# Patient Record
Sex: Male | Born: 1960 | Race: White | Hispanic: No | Marital: Single | Smoking: Never smoker
Health system: Southern US, Community
[De-identification: ages and names within clinical notes are randomized; demographics above are authoritative.]

## PROBLEM LIST (undated history)

## (undated) DIAGNOSIS — F329 Major depressive disorder, single episode, unspecified: Secondary | ICD-10-CM

## (undated) DIAGNOSIS — Z8669 Personal history of other diseases of the nervous system and sense organs: Secondary | ICD-10-CM

## (undated) DIAGNOSIS — F32A Depression, unspecified: Secondary | ICD-10-CM

## (undated) DIAGNOSIS — F419 Anxiety disorder, unspecified: Secondary | ICD-10-CM

## (undated) HISTORY — PX: CARDIAC CATHETERIZATION: SHX172

## (undated) HISTORY — PX: NASAL SEPTUM SURGERY: SHX37

## (undated) HISTORY — PX: APPENDECTOMY: SHX54

---

## 1999-11-23 ENCOUNTER — Encounter (INDEPENDENT_AMBULATORY_CARE_PROVIDER_SITE_OTHER): Payer: Self-pay | Admitting: Specialist

## 1999-11-23 ENCOUNTER — Inpatient Hospital Stay (HOSPITAL_COMMUNITY): Admission: EM | Admit: 1999-11-23 | Discharge: 1999-11-25 | Payer: Self-pay | Admitting: Emergency Medicine

## 2000-01-03 ENCOUNTER — Encounter: Payer: Self-pay | Admitting: Internal Medicine

## 2000-01-03 ENCOUNTER — Ambulatory Visit (HOSPITAL_COMMUNITY): Admission: RE | Admit: 2000-01-03 | Discharge: 2000-01-03 | Payer: Self-pay | Admitting: Internal Medicine

## 2000-08-08 ENCOUNTER — Ambulatory Visit (HOSPITAL_COMMUNITY): Admission: RE | Admit: 2000-08-08 | Discharge: 2000-08-08 | Payer: Self-pay | Admitting: Internal Medicine

## 2000-08-08 ENCOUNTER — Encounter: Payer: Self-pay | Admitting: Internal Medicine

## 2001-02-06 ENCOUNTER — Encounter: Admission: RE | Admit: 2001-02-06 | Discharge: 2001-02-06 | Payer: Self-pay | Admitting: Neurosurgery

## 2001-03-10 ENCOUNTER — Encounter: Admission: RE | Admit: 2001-03-10 | Discharge: 2001-03-10 | Payer: Self-pay | Admitting: Neurosurgery

## 2001-11-27 ENCOUNTER — Encounter: Admission: RE | Admit: 2001-11-27 | Discharge: 2001-11-27 | Payer: Self-pay | Admitting: Internal Medicine

## 2001-11-27 ENCOUNTER — Encounter: Payer: Self-pay | Admitting: Internal Medicine

## 2001-12-11 ENCOUNTER — Encounter: Payer: Self-pay | Admitting: Urology

## 2001-12-11 ENCOUNTER — Ambulatory Visit (HOSPITAL_BASED_OUTPATIENT_CLINIC_OR_DEPARTMENT_OTHER): Admission: RE | Admit: 2001-12-11 | Discharge: 2001-12-11 | Payer: Self-pay | Admitting: Urology

## 2002-06-26 ENCOUNTER — Encounter: Admission: RE | Admit: 2002-06-26 | Discharge: 2002-06-26 | Payer: Self-pay | Admitting: Internal Medicine

## 2002-06-26 ENCOUNTER — Encounter: Payer: Self-pay | Admitting: Internal Medicine

## 2006-08-25 IMAGING — CT CT HEAD W/O CM - CT MAXILLOFACIAL W/O CM
3 series · 16 of 47 positions shown, 19 images · non-contrast
Comparison: NONE

CLINICAL DATA: Allergies, sinus troubles times 20 years. 

CT PARANASAL SINUSES WITHOUT INTRAVENOUS CONTRAST
TECHNIQUE: Multiple axial 3 mm thick slices at 3 mm intervals 
were obtained. Sagittal and coronal reconstructions were obtained 
as well.

[Series 3: 3x3 · axial · 0.35mm/px · z∈[-444,-318]mm · 10 of 50 slices shown, 13 images]
[im 4/50  brain]
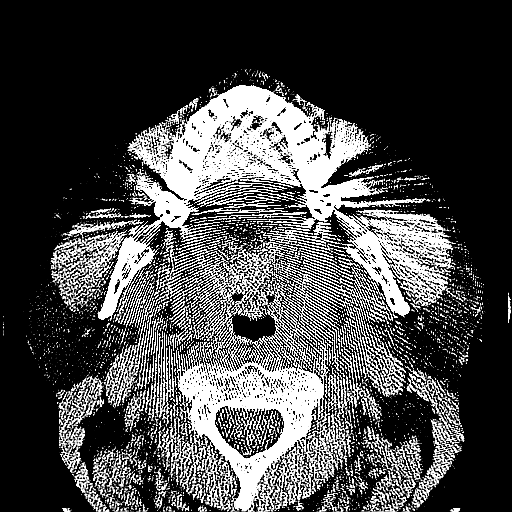
[im 4/50  bone]
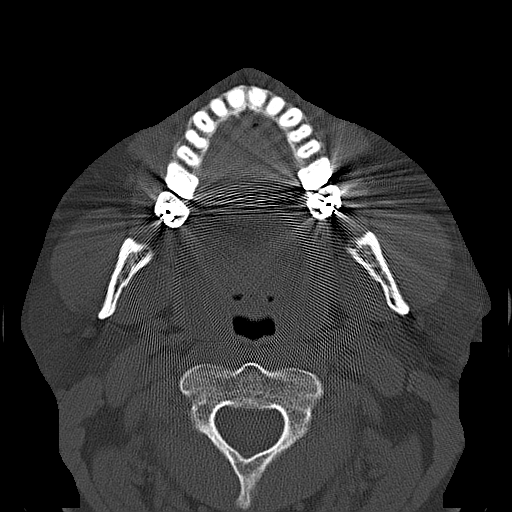
[im 9/50  bone]
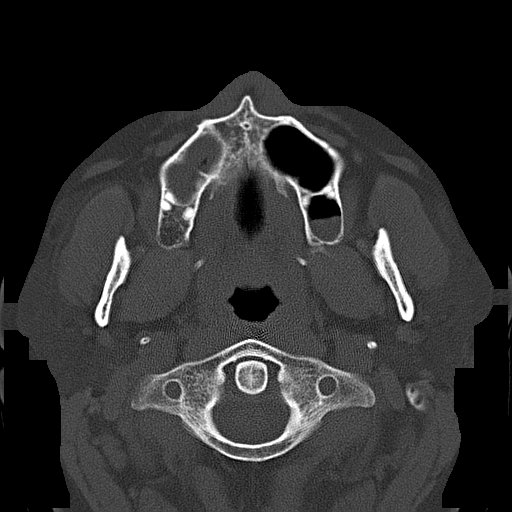
[im 12/50  bone]
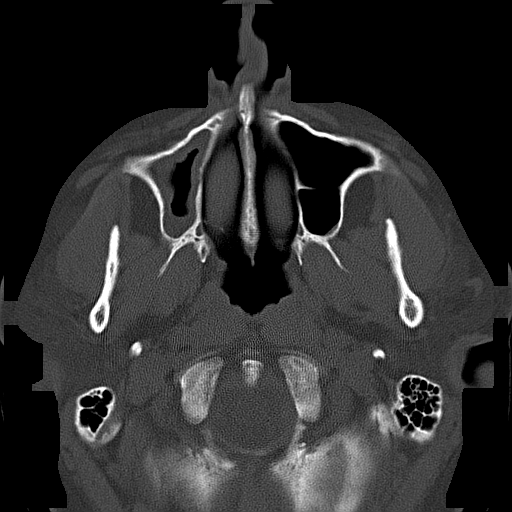
[im 17/50  bone]
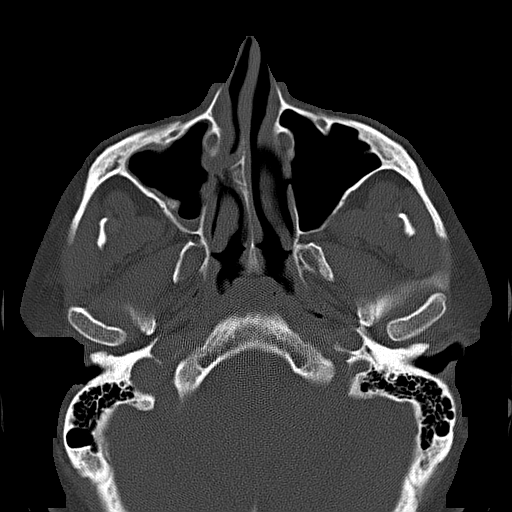
[im 22/50  brain]
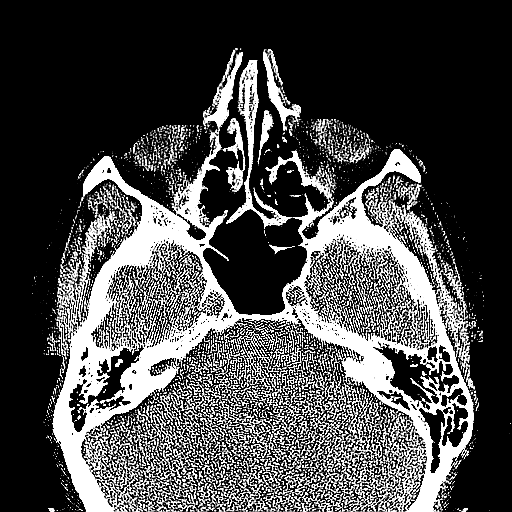
[im 22/50  bone]
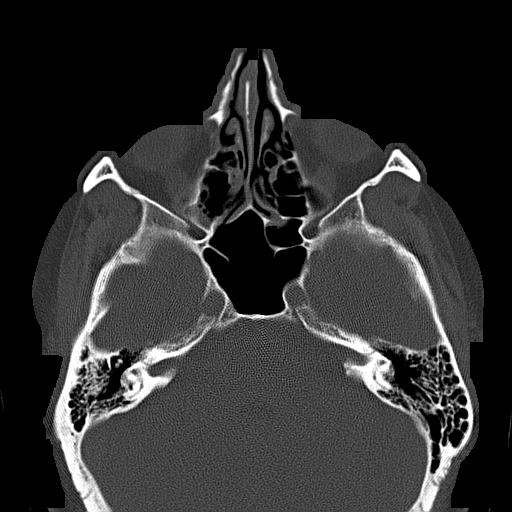
[im 26/50  bone]
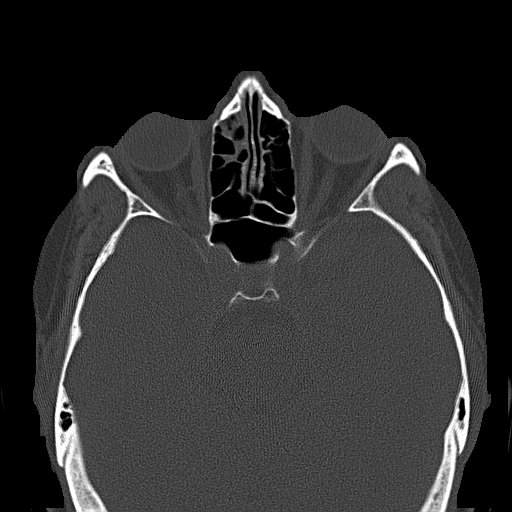
[im 31/50  bone]
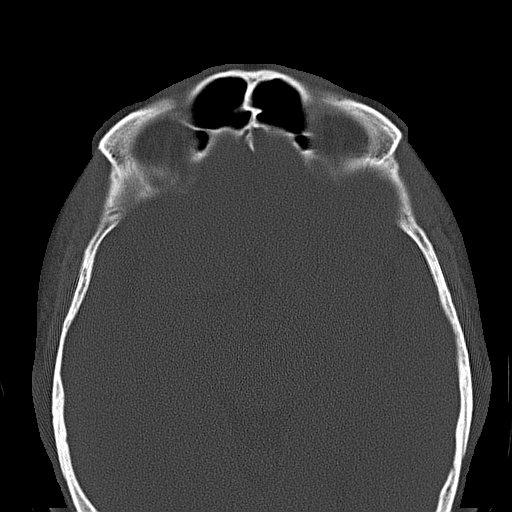
[im 38/50  bone]
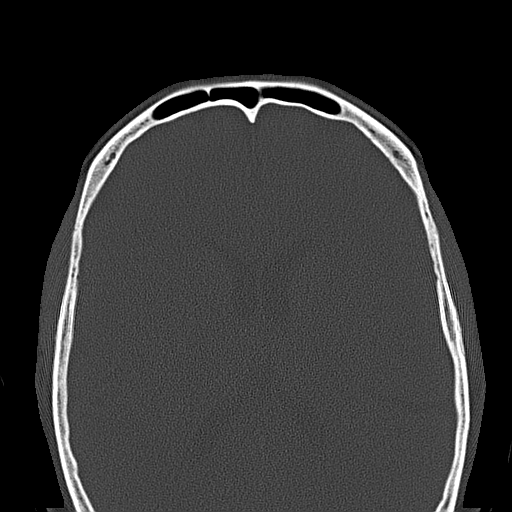
[im 41/50  brain]
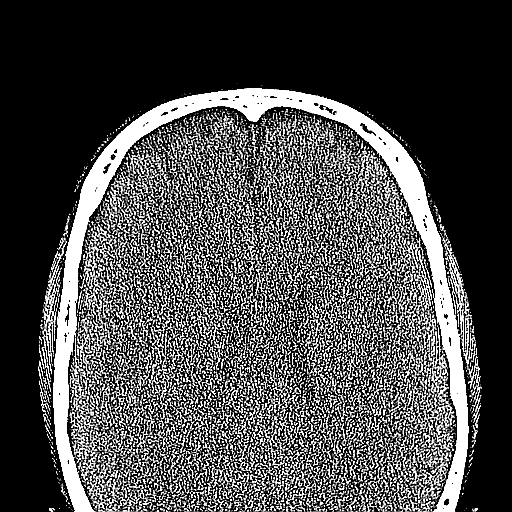
[im 41/50  bone]
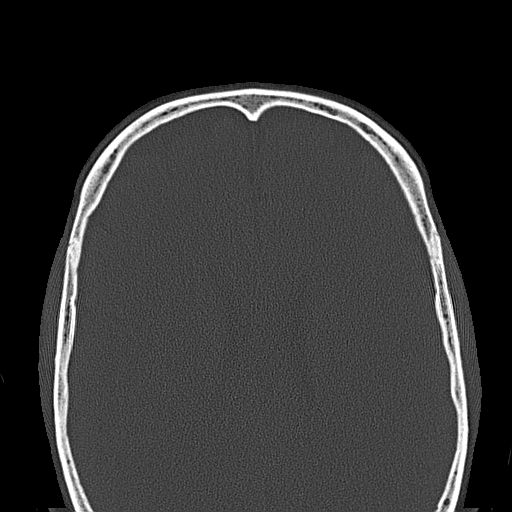
[im 46/50  bone]
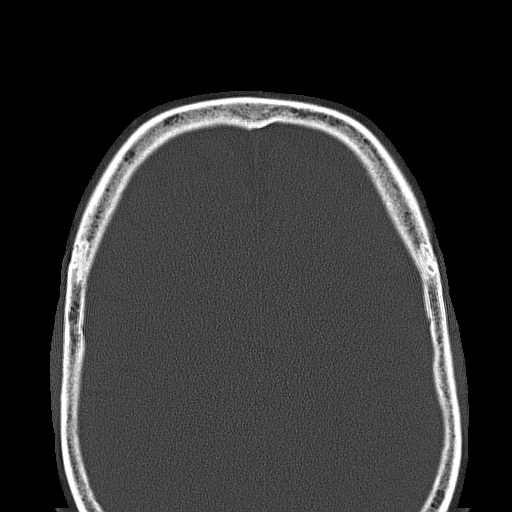

[coronals · coronal · 0.35mm/px · 3 of 32 slices shown]
[im 11/32  bone]
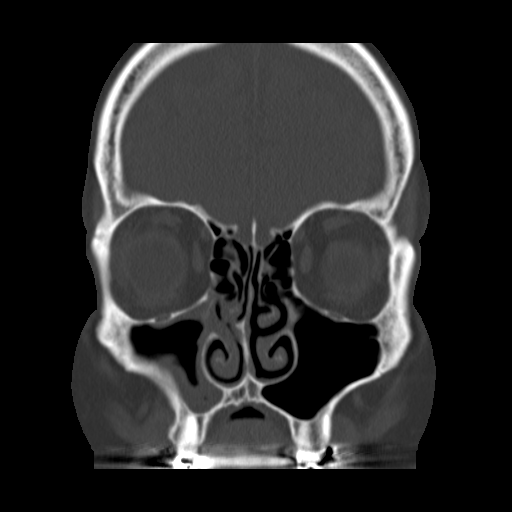
[im 14/32  bone]
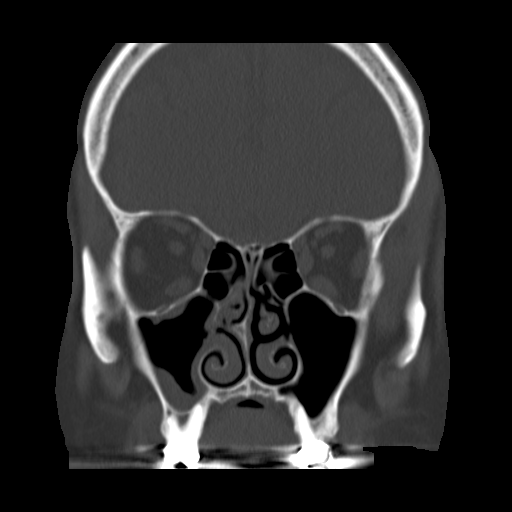
[im 18/32  bone]
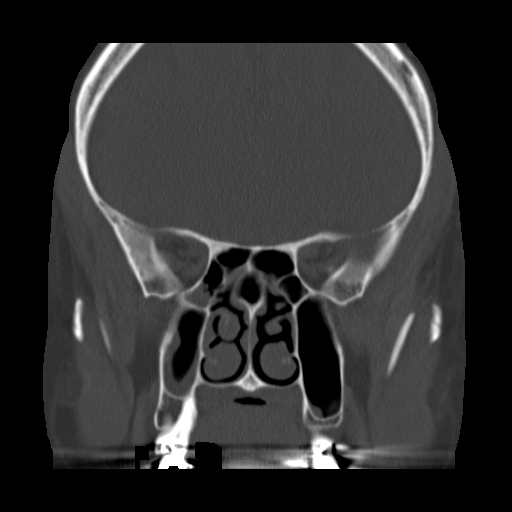

[sagittals · sagittal · 0.35mm/px · 3 of 34 slices shown]
[im 12/34  bone]
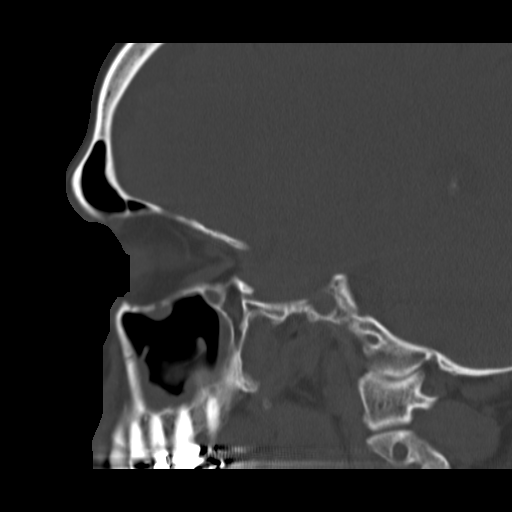
[im 17/34  bone]
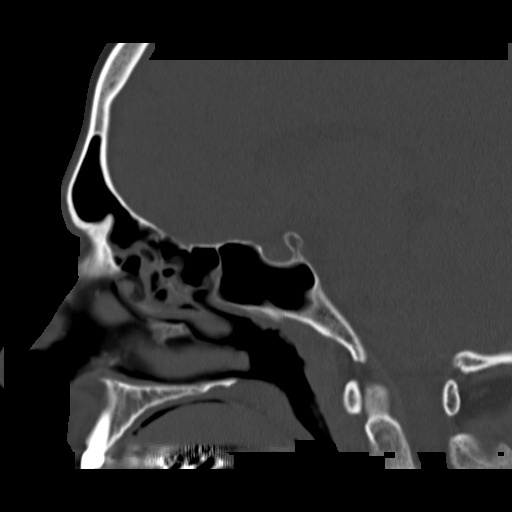
[im 23/34  bone]
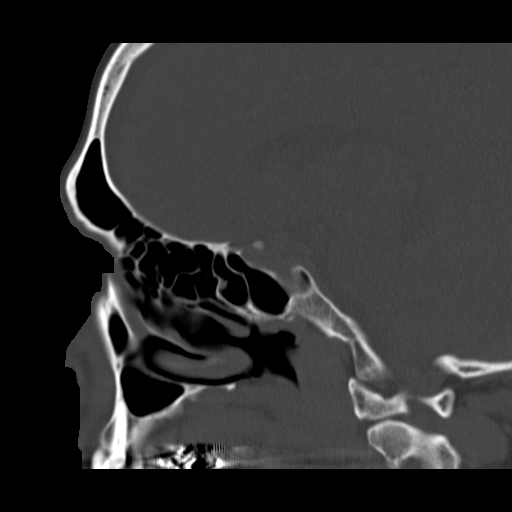

[16 of 47 positions shown; findings below may reference images not displayed]

FINDINGS: Study reveals moderate mucoperiosteal thickening in the 
right maxillary and middle ethmoid sinuses with obstruction of the 
osteomeatal unit with debris.  Left maxillary sinus is clear.  
Mild right frontal ethmoid recess sinusitis.  Sphenoid sinus is 
clear.  Nasal septum is deviated to the right with a very large 
rightward septal spur.  Right inferior and middle turbinates are 
mildly enlarged.
IMPRESSION: Right-sided sinusitis, right-sided septal deviation 
and spur and turbinate hypertrophy. Tchami Pambe, M.D. 
Date: 12/10/2005 NBC  JLM

## 2010-06-02 ENCOUNTER — Encounter
Admission: RE | Admit: 2010-06-02 | Discharge: 2010-06-02 | Payer: Self-pay | Source: Home / Self Care | Attending: Neurosurgery | Admitting: Neurosurgery

## 2010-11-03 NOTE — H&P (Signed)
[place]  Patient:    Arthur Cole, Arthur Cole                     MRN: [id number] Adm. Date:  [id number] Attending:  Bonnetta Barry CC:         Lorre Nick, M.D.                         History and Physical  ADMITTING DIAGNOSIS: Acute appendicitis.  HISTORY OF PRESENT ILLNESS: The patient is a 50 year old white male who awoke this morning with diffuse abdominal pain followed by onset of nausea and vomiting on two occasions.  The pain then localized to the right lower quadrant and persisted through the day.  He has had a low-grade fever.  He presented to the [facility] Emergency Department on the afternoon of November 23, 1999 for evaluation.  The patient was noted to have a temperature of 100.5 degrees.  The emergency room physician evaluated him and felt examination was consistent with acute appendicitis.  CT scan of the abdomen was ordered and general surgery was consulted.  WBC was noted to be elevated at 12,300.  After discussion with the patient and family the decision was made to proceed to appendectomy.  CAT scan of the abdomen was cancelled.  PAST MEDICAL HISTORY: History of environmental allergies.  PAST SURGICAL HISTORY: None.  MEDICATIONS:  1. Nasalide p.r.n.  2. Over-the-counter sleep medication.  ALLERGIES: No known drug allergies.  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  He does not smoke.  He does not drink alcohol. He is accompanied by his wife.  REVIEW OF SYSTEMS: Fifteen system review was discussed with the patient and documented on the emergency room sheet without significant positives.  FAMILY HISTORY: Noncontributory.  PHYSICAL EXAMINATION:  GENERAL: The patient is a 50 year old well-developed, well-nourished white male on a stretcher in the emergency department.  VITAL SIGNS: Temperature 100.5 degrees, blood pressure 123/71, pulse  90, respirations 24.  HEENT: Head normocephalic.  Sclerae clear.  Mucous membranes are slightly dry. Dentition is good.  NECK: Supple, thyroid without nodularity.  CHEST: Clear to auscultation and percussion.  No costovertebral angle tenderness.  CARDIAC: Regular rate and rhythm with a soft diastolic murmur at the upper left sternal border.  Peripheral pulses full.  ABDOMEN: Soft.  There are bowel sounds present.  There is tenderness to percussion, however, in the right lower quadrant at approximately McBurneys point and just lateral to McBurneys point.  There is tenderness to palpation in this region without palpable mass.  There is rebound tenderness in the right lower quadrant.  EXTREMITIES:  Nontender, without edema.  NEUROLOGIC: The patient is alert and oriented to person, place, and time, without focal neurologic deficit.  LABORATORY DATA: CBC demonstrates a WBC of 12.3, hemoglobin 13.2, hematocrit 38.7%, platelet count 222,000; Differential shows 77% neutrophils, 16% lymphocytes, 5% monocytes, 1% eosinophils.  Urinalysis is benign.  IMPRESSION: Probable acute appendicitis.  PLAN: I had a lengthy discussion with the patient and his wife at the bedside and explained to them the signs and symptoms of acute appendicitis.  We discussed the etiology of appendicitis as well as the surgical management.  I discussed with them the use of a laparoscope versus an open technique.  I also explained to them that based on his clinical presentation and laboratory studies I did not feel a  CT scan was indicated, and we have cancelled the CT scan of the abdomen.  The patient is in agreement to proceed with laparoscopic appendectomy.  They know that the diagnostic accuracy based on clinical rounds in approximately 90%.  They also understand the risk of conversion to an open procedure.  I discussed the course of the hospitalization under routine circumstances as well as his convalescence at  home and return to work.  The patient understands and wishes to proceed.  1. Admit to [facility].  2. Initiation of intravenous antibiotics.  3. NPO.  4. Operating room for laparoscopic appendectomy this evening. DD:  11/23/99 TD:  11/24/99 Job: [id number]

## 2010-11-03 NOTE — Op Note (Signed)
Arthur Cole. [facility]  Patient:    [name], [name]                     MRN: [id number] Proc. Date: 11/23/99 Adm. Date:  [id number] Disc. Date: [id number] Attending:  Bonnetta Barry                           Operative Report  PREOPERATIVE DIAGNOSIS:  Acute appendicitis.  POSTOPERATIVE DIAGNOSIS:  Acute appendicitis.  PROCEDURE:  Laparoscopic appendectomy.  SURGEON:  Velora Heckler, M.D.  ANESTHESIA:  General.  ESTIMATED BLOOD LOSS:  Minimal.  PREPARATION:  Betadine.  COMPLICATIONS:  None.  INDICATIONS:  The patient is a 50 year old white male who presents with 12-hour history of abdominal pain, localizing to the right lower quadrant, associated with nausea and vomiting.  Physical exam is consistent with acute appendicitis.  White blood cell count is elevated to 12,300, and temperature is 100.5 degrees.  Diagnosis of appendicitis is made.  The patient is brought to the operating room for exploration.  DESCRIPTION OF PROCEDURE:  The procedure was done in OR #15 at the [facility].  The patient was brought to the operating room and placed in the supine position on the operating room table.  Following the administration of general anesthesia, the patient was prepped and draped in the usual strict aseptic fashion.  After ascertaining that an adequate level of anesthesia had been obtained, a supraumbilical incision was made with a #15 blade.  Dissection was carried down through the subcutaneous tissues.  The fascia was incised in the midline.  The peritoneal cavity was entered cautiously.  An 0 Vicryl pursestring suture was placed in the fascia.  An Hassan cannula was introduced under direct vision and secured with a pursestring suture.  The abdomen was explored following insufflation with carbon dioxide.  The liver was normal.  The gallbladder was normal.  The stomach is normal.  The colon appears normal.  The sigmoid colon and  pelvis appeared normal.  In the right lower quadrant, there is an obvious inflamed appendix which is sitting relatively anterior.  It is thickened and indurated, but there is no sign of perforation.  Operative ports were placed along the right costal margin and in the left lower quadrant.  The appendix was grasped and retracted anteriorly.  Using a [employer], a window was made at the base of the appendix.  Using the GIA-type stapler with vascular cartridge, the appendiceal base was transected.  A _______ for transection of the mesoappendix.  The appendix was placed into an Endocatch bag and withdrawn through the left lower quadrant port without difficulty.  The right lower quadrant was then inspected for hemostasis.  Good hemostasis was noted.  Pneumoperitoneum was released and ports were removed under direct vision.  The 0 Vicryl pursestring suture was tied securely.  The wounds were anesthetized with local anesthetic.  All three wound were closed with interrupted 4-0 Vicryl subcuticular sutures.  Benzoin and Steri-Strips were applied. Sterile gauze dressings were applied.  The patient was awakened from anesthesia and taken to the recovery room in stable condition.  The patient tolerated the procedure well. DD:  11/23/99 TD:  11/28/99 Job: [id number]

## 2010-11-03 NOTE — Discharge Summary (Signed)
[place]. [facility]  Patient:    Arthur Cole, Arthur Cole                     MRN: [id number] Adm. Date:  [id number] Disc. Date: [id number] Attending:  Bonnetta Barry CC:         Velora Heckler, M.D.                           Discharge Summary  CHIEF COMPLAINT: Acute appendicitis.  HISTORY OF PRESENT ILLNESS: The patient is a 50 year old white male who presented to the emergency department with a 12 hour history of abdominal pain localizing to the right lower quadrant.  This was accompanied by fever and nausea and vomiting.  PHYSICAL EXAMINATION: Physical examination was consistent with acute appendicitis.  LABORATORY DATA: Laboratory studies showed an elevated WBC of 12,300 with 77% segmented neutrophils.  HOSPITAL COURSE: After full consultation the patient was prepared and taken to the operating room.  The patient was taken to the operating room on the evening of November 23, 1999 and underwent laparoscopic appendectomy.  His postoperative course was straightforward.  He was treated with 24 hours of IV antibiotics.  He was advanced from clear liquids to a regular diet.  He was prepared for discharge home on the second postoperative day.  DISPOSITION: The patient is discharged home on November 25, 1999.  DISCHARGE CONDITION: Good, tolerating a regular diet and ambulating independently.  FOLLOW-UP: The patient will be seen back in my office at [facility] in two weeks.  DISCHARGE MEDICATIONS: Vicodin as-needed for pain.  FINAL DIAGNOSIS: Acute suppurative appendicitis. DD:  12/14/99 TD:  12/15/99 Job: [id number]

## 2014-01-05 DIAGNOSIS — N529 Male erectile dysfunction, unspecified: Secondary | ICD-10-CM | POA: Insufficient documentation

## 2014-01-05 DIAGNOSIS — E291 Testicular hypofunction: Secondary | ICD-10-CM | POA: Insufficient documentation

## 2014-02-08 DIAGNOSIS — E291 Testicular hypofunction: Secondary | ICD-10-CM | POA: Insufficient documentation

## 2014-10-06 ENCOUNTER — Other Ambulatory Visit: Payer: Self-pay | Admitting: Anesthesiology

## 2014-10-20 ENCOUNTER — Encounter (HOSPITAL_COMMUNITY): Payer: Self-pay

## 2014-10-20 ENCOUNTER — Encounter (HOSPITAL_COMMUNITY)
Admission: RE | Admit: 2014-10-20 | Discharge: 2014-10-20 | Disposition: A | Discharge status: Home / Self Care | Payer: Managed Care, Other (non HMO) | Source: Ambulatory Visit | Attending: Anesthesiology | Admitting: Anesthesiology

## 2014-10-20 DIAGNOSIS — M4726 Other spondylosis with radiculopathy, lumbar region: Secondary | ICD-10-CM | POA: Insufficient documentation

## 2014-10-20 DIAGNOSIS — Z01812 Encounter for preprocedural laboratory examination: Secondary | ICD-10-CM | POA: Insufficient documentation

## 2014-10-20 HISTORY — DX: Depression, unspecified: F32.A

## 2014-10-20 HISTORY — DX: Major depressive disorder, single episode, unspecified: F32.9

## 2014-10-20 LAB — BASIC METABOLIC PANEL
Anion gap: 8 (ref 5–15)
BUN: 10 mg/dL (ref 6–20)
CALCIUM: 8.7 mg/dL — AB (ref 8.9–10.3)
CO2: 26 mmol/L (ref 22–32)
Chloride: 105 mmol/L (ref 101–111)
Creatinine, Ser: 1.22 mg/dL (ref 0.61–1.24)
Glucose, Bld: 90 mg/dL (ref 70–99)
Potassium: 4.4 mmol/L (ref 3.5–5.1)
SODIUM: 139 mmol/L (ref 135–145)

## 2014-10-20 LAB — CBC
HEMATOCRIT: 35.4 % — AB (ref 39.0–52.0)
Hemoglobin: 10.9 g/dL — ABNORMAL LOW (ref 13.0–17.0)
MCH: 22.3 pg — ABNORMAL LOW (ref 26.0–34.0)
MCHC: 30.8 g/dL (ref 30.0–36.0)
MCV: 72.4 fL — ABNORMAL LOW (ref 78.0–100.0)
Platelets: 198 10*3/uL (ref 150–400)
RBC: 4.89 MIL/uL (ref 4.22–5.81)
RDW: 17 % — AB (ref 11.5–15.5)
WBC: 7 10*3/uL (ref 4.0–10.5)

## 2014-10-20 LAB — SURGICAL PCR SCREEN
MRSA, PCR: NEGATIVE
Staphylococcus aureus: NEGATIVE

## 2014-10-20 NOTE — Pre-Procedure Instructions (Signed)
Arthur Cole  10/20/2014   Your procedure is scheduled on:  Oct 29, 2014  Report to [facility] Admitting at 7:45 AM.  Call this number if you have problems the morning of surgery: [phone]   Remember:   Do not eat food or drink liquids after midnight.   Take these medicines the morning of surgery with A SIP OF WATER: none    STOP ASPIRIN, NSAIDS (ADVIL, ALEVE, IBUPROFEN), HERBAL SUPPLEMENTS ONE WEEK PRIOR TO SURGERY   Do not wear jewelry.  Do not wear lotions, powders, or colognes. You may wear deodorant.  Men may shave face and neck.  Do not bring valuables to the hospital.  [facility] is not responsible for any belongings or valuables.               Contacts, dentures or bridgework may not be worn into surgery.  Leave suitcase in the car. After surgery it may be brought to your room.  For patients admitted to the hospital, discharge time is determined by your treatment team.               Patients discharged the day of surgery will not be allowed to drive home.  Name and phone number of your driver:   Special Instructions: "preparing for surgery"   Please read over the following fact sheets that you were given: Pain Booklet, Coughing and Deep Breathing and Surgical Site Infection Prevention

## 2014-10-21 ENCOUNTER — Other Ambulatory Visit (HOSPITAL_COMMUNITY): Payer: Self-pay

## 2014-10-28 MED ORDER — SODIUM CHLORIDE 0.9 % IV SOLN
1500.0000 mg | INTRAVENOUS | Status: AC
Start: 2014-10-29 — End: 2014-10-29
  Administered 2014-10-29: 1500 mg via INTRAVENOUS
  Filled 2014-10-28: qty 1500

## 2014-10-28 NOTE — H&P (Signed)
Arthur Cole is an 54 y.o. male.   Chief Complaint: Thoracic and lumbar pain, radiation towards lower extremities HPI: Exceptionally pleasant 54 year old gentleman, with multilevel lumbar spondylosis/disc degeneration, with associated complaints of low back pain and radiation towards lower extremities.  He has undergone physical therapy, medication management, interventional pain management, none of which gave him sustained benefit of any type or gave him substantial side effects.  His back pain was extremely limiting to him, and with failure of all these other modalities, spinal cord supinator therapy was recommended as a trial. The patient reported that he's had about a 60-70% improvement in his thoracic and low back pain during the course of the trial.  He did not use any of his pain medication in the last 3-4 days of the trial, and was using only random medication for the first few days of his trial.  Overall, he is extremely pleased with his outcome.    Past Medical History  Diagnosis Date  . Anxiety   . Hx of sleep apnea     "had surgery to correct"  . Depression     "2009 hospitalized for depression after death of mother, drug overdose"    Past Surgical History  Procedure Laterality Date  . Appendectomy    . Cardiac catheterization      Dr. Elsie LincolnGamble, 13 years ago  . Nasal septum surgery      No family history on file. Social History:  reports that he has never smoked. He does not have any smokeless tobacco history on file. He reports that he does not drink alcohol or use illicit drugs.  Allergies:  Allergies  Allergen Reactions  . Amoxicillin Itching    "swelling and hives"  . Penicillins Itching    "swelling and hives"    Medications Prior to Admission  Medication Sig Dispense Refill  . clorazepate (TRANXENE) 7.5 MG tablet Take 3.75-7.5 mg by mouth 2 (two) times daily as needed.    . cyclobenzaprine (FLEXERIL) 10 MG tablet Take 10 mg by mouth 3 (three) times daily as  needed for muscle spasms.    . fluticasone (FLONASE) 50 MCG/ACT nasal spray Place 2 sprays into both nostrils daily.    . tadalafil (CIALIS) 20 MG tablet Take 1 tablet by mouth daily as needed.    . traZODone (DESYREL) 100 MG tablet Take 100 mg by mouth at bedtime.    . Testosterone 75 MG PLLT Inject 75 mg into the skin every 3 (three) months.      No results found for this or any previous visit (from the past 48 hour(s)). No results found.  Review of Systems  Constitutional: Negative.   HENT: Negative.   Eyes: Negative.   Respiratory: Negative.   Cardiovascular: Negative.   Gastrointestinal: Negative.   Genitourinary: Negative.   Musculoskeletal: Positive for back pain.  Skin: Negative.   Neurological: Negative.   Endo/Heme/Allergies: Negative.   Psychiatric/Behavioral: Negative.     Blood pressure 141/89, pulse 83, temperature 97 F (36.1 C), temperature source Oral, resp. rate 18, weight 119.296 kg (263 lb), SpO2 100 %. Physical Exam  Constitutional: He is oriented to person, place, and time. He appears well-developed and well-nourished.  HENT:  Head: Normocephalic and atraumatic.  Eyes: EOM are normal. Pupils are equal, round, and reactive to light.  Neck: Normal range of motion.  Cardiovascular: Normal rate and regular rhythm.   Musculoskeletal: Normal range of motion.  Neurological: He is alert and oriented to person, place, and time.  Skin: Skin is warm and dry.  Psychiatric: He has a normal mood and affect. His behavior is normal. Judgment and thought content normal.     Assessment/Plan ASSESSMENT: Multiple level lumbar spondylosis, low back pain, thoracic pain, radiation towards lower extremity is PLAN: Permanent implant of [employer] SCS  Gwynne Edingeraul C Marissia Blackham 10/29/2014, 8:44 AM

## 2014-10-29 ENCOUNTER — Encounter (HOSPITAL_COMMUNITY): Payer: Self-pay | Admitting: Certified Registered Nurse Anesthetist

## 2014-10-29 ENCOUNTER — Ambulatory Visit (HOSPITAL_COMMUNITY)
Admission: RE | Admit: 2014-10-29 | Discharge: 2014-10-29 | Disposition: A | Discharge status: Home / Self Care | Payer: Managed Care, Other (non HMO) | Source: Ambulatory Visit | Attending: Anesthesiology | Admitting: Anesthesiology

## 2014-10-29 ENCOUNTER — Ambulatory Visit (HOSPITAL_COMMUNITY): Payer: Managed Care, Other (non HMO) | Admitting: Certified Registered Nurse Anesthetist

## 2014-10-29 ENCOUNTER — Ambulatory Visit (HOSPITAL_COMMUNITY): Payer: Managed Care, Other (non HMO)

## 2014-10-29 ENCOUNTER — Encounter (HOSPITAL_COMMUNITY)
Admission: RE | Disposition: A | Discharge status: Home / Self Care | Payer: Self-pay | Source: Ambulatory Visit | Attending: Anesthesiology

## 2014-10-29 DIAGNOSIS — M5136 Other intervertebral disc degeneration, lumbar region: Secondary | ICD-10-CM | POA: Insufficient documentation

## 2014-10-29 DIAGNOSIS — G8929 Other chronic pain: Secondary | ICD-10-CM | POA: Insufficient documentation

## 2014-10-29 DIAGNOSIS — Z419 Encounter for procedure for purposes other than remedying health state, unspecified: Secondary | ICD-10-CM

## 2014-10-29 DIAGNOSIS — K219 Gastro-esophageal reflux disease without esophagitis: Secondary | ICD-10-CM | POA: Diagnosis not present

## 2014-10-29 HISTORY — DX: Anxiety disorder, unspecified: F41.9

## 2014-10-29 HISTORY — DX: Personal history of other diseases of the nervous system and sense organs: Z86.69

## 2014-10-29 HISTORY — PX: SPINAL CORD STIMULATOR INSERTION: SHX5378

## 2014-10-29 SURGERY — INSERTION, SPINAL CORD STIMULATOR, LUMBAR
Anesthesia: Monitor Anesthesia Care

## 2014-10-29 MED ORDER — FENTANYL CITRATE (PF) 100 MCG/2ML IJ SOLN
INTRAMUSCULAR | Status: DC | PRN
  Administered 2014-10-29 (×6): 25 ug via INTRAVENOUS

## 2014-10-29 MED ORDER — LIDOCAINE HCL (CARDIAC) 20 MG/ML IV SOLN
INTRAVENOUS | Status: AC
  Filled 2014-10-29: qty 5

## 2014-10-29 MED ORDER — PROPOFOL 10 MG/ML IV BOLUS
INTRAVENOUS | Status: AC
  Filled 2014-10-29: qty 20

## 2014-10-29 MED ORDER — LACTATED RINGERS IV SOLN
INTRAVENOUS | Status: DC
  Administered 2014-10-29 (×2): via INTRAVENOUS

## 2014-10-29 MED ORDER — SODIUM CHLORIDE 0.9 % IR SOLN
Status: DC | PRN
  Administered 2014-10-29: 09:00:00

## 2014-10-29 MED ORDER — MEPERIDINE HCL 25 MG/ML IJ SOLN: 6.2500 mg | INTRAMUSCULAR | Status: DC | PRN

## 2014-10-29 MED ORDER — ROCURONIUM BROMIDE 50 MG/5ML IV SOLN
INTRAVENOUS | Status: AC
  Filled 2014-10-29: qty 1

## 2014-10-29 MED ORDER — 0.9 % SODIUM CHLORIDE (POUR BTL) OPTIME
TOPICAL | Status: DC | PRN
  Administered 2014-10-29: 1000 mL

## 2014-10-29 MED ORDER — ONDANSETRON HCL 4 MG/2ML IJ SOLN
INTRAMUSCULAR | Status: AC
  Filled 2014-10-29: qty 2

## 2014-10-29 MED ORDER — EPHEDRINE SULFATE 50 MG/ML IJ SOLN
INTRAMUSCULAR | Status: AC
  Filled 2014-10-29: qty 1

## 2014-10-29 MED ORDER — CLINDAMYCIN HCL 150 MG PO CAPS: 150.0000 mg | ORAL_CAPSULE | Freq: Three times a day (TID) | ORAL | Status: DC

## 2014-10-29 MED ORDER — FENTANYL CITRATE (PF) 250 MCG/5ML IJ SOLN
INTRAMUSCULAR | Status: AC
  Filled 2014-10-29: qty 5

## 2014-10-29 MED ORDER — BUPIVACAINE-EPINEPHRINE (PF) 0.5% -1:200000 IJ SOLN
INTRAMUSCULAR | Status: DC | PRN
Start: 2014-10-29 — End: 2014-10-29
  Administered 2014-10-29: 39 mL via PERINEURAL

## 2014-10-29 MED ORDER — MIDAZOLAM HCL 2 MG/2ML IJ SOLN: 0.5000 mg | Freq: Once | INTRAMUSCULAR | Status: DC | PRN

## 2014-10-29 MED ORDER — FENTANYL CITRATE (PF) 100 MCG/2ML IJ SOLN: 25.0000 ug | INTRAMUSCULAR | Status: DC | PRN

## 2014-10-29 MED ORDER — BACITRACIN-NEOMYCIN-POLYMYXIN OINTMENT TUBE
TOPICAL_OINTMENT | CUTANEOUS | Status: DC | PRN
  Administered 2014-10-29: 1 via TOPICAL

## 2014-10-29 MED ORDER — MIDAZOLAM HCL 5 MG/5ML IJ SOLN
INTRAMUSCULAR | Status: DC | PRN
  Administered 2014-10-29: 2 mg via INTRAVENOUS

## 2014-10-29 MED ORDER — MIDAZOLAM HCL 2 MG/2ML IJ SOLN
INTRAMUSCULAR | Status: AC
  Filled 2014-10-29: qty 2

## 2014-10-29 MED ORDER — HYDROCODONE-ACETAMINOPHEN 10-325 MG PO TABS: 1.0000 | ORAL_TABLET | ORAL | Status: AC | PRN

## 2014-10-29 MED ORDER — SODIUM CHLORIDE 0.9 % IJ SOLN
INTRAMUSCULAR | Status: AC
  Filled 2014-10-29: qty 10

## 2014-10-29 MED ORDER — PROMETHAZINE HCL 25 MG/ML IJ SOLN: 6.2500 mg | INTRAMUSCULAR | Status: DC | PRN

## 2014-10-29 MED ORDER — PROPOFOL INFUSION 10 MG/ML OPTIME
INTRAVENOUS | Status: DC | PRN
  Administered 2014-10-29: 25 ug/kg/min via INTRAVENOUS

## 2014-10-29 SURGICAL SUPPLY — 69 items
ANCH LD 4 SETX2 CLIK X (Stimulator) ×1 IMPLANT
ANCHOR CLIK X NEURO (Stimulator) ×1 IMPLANT
APL SKNCLS STERI-STRIP NONHPOA (GAUZE/BANDAGES/DRESSINGS)
BAG DECANTER FOR FLEXI CONT (MISCELLANEOUS) ×2 IMPLANT
BENZOIN TINCTURE PRP APPL 2/3 (GAUZE/BANDAGES/DRESSINGS) IMPLANT
BINDER ABDOMINAL 12 ML 46-62 (SOFTGOODS) ×2 IMPLANT
BLADE CLIPPER SURG (BLADE) IMPLANT
CABLE/EXTENSION OR 1X16 61 (CABLE) ×2 IMPLANT
CHLORAPREP W/TINT 26ML (MISCELLANEOUS) ×2 IMPLANT
CLIP TI WIDE RED SMALL 6 (CLIP) IMPLANT
CONT SPEC 4OZ CLIKSEAL STRL BL (MISCELLANEOUS) ×2 IMPLANT
DRAPE C-ARM 42X72 X-RAY (DRAPES) ×2 IMPLANT
DRAPE C-ARMOR (DRAPES) ×2 IMPLANT
DRAPE LAPAROTOMY 100X72X124 (DRAPES) ×2 IMPLANT
DRAPE POUCH INSTRU U-SHP 10X18 (DRAPES) ×2 IMPLANT
DRAPE SURG 17X23 STRL (DRAPES) ×2 IMPLANT
DRSG OPSITE POSTOP 3X4 (GAUZE/BANDAGES/DRESSINGS) ×1 IMPLANT
DRSG OPSITE POSTOP 4X6 (GAUZE/BANDAGES/DRESSINGS) ×1 IMPLANT
DRSG TELFA 3X8 NADH (GAUZE/BANDAGES/DRESSINGS) IMPLANT
ELECT REM PT RETURN 9FT ADLT (ELECTROSURGICAL) ×2
ELECTRODE REM PT RTRN 9FT ADLT (ELECTROSURGICAL) ×1 IMPLANT
GAUZE SPONGE 4X4 16PLY XRAY LF (GAUZE/BANDAGES/DRESSINGS) ×2 IMPLANT
GLOVE BIOGEL PI IND STRL 7.5 (GLOVE) ×1 IMPLANT
GLOVE BIOGEL PI INDICATOR 7.5 (GLOVE) ×1
GLOVE ECLIPSE 7.5 STRL STRAW (GLOVE) ×2 IMPLANT
GLOVE EXAM NITRILE LRG STRL (GLOVE) IMPLANT
GLOVE EXAM NITRILE MD LF STRL (GLOVE) IMPLANT
GLOVE EXAM NITRILE XL STR (GLOVE) IMPLANT
GLOVE EXAM NITRILE XS STR PU (GLOVE) IMPLANT
GLOVE INDICATOR 7.5 STRL GRN (GLOVE) ×2 IMPLANT
GLOVE SURG SS PI 7.0 STRL IVOR (GLOVE) ×3 IMPLANT
GOWN STRL REUS W/ TWL LRG LVL3 (GOWN DISPOSABLE) IMPLANT
GOWN STRL REUS W/ TWL XL LVL3 (GOWN DISPOSABLE) IMPLANT
GOWN STRL REUS W/TWL 2XL LVL3 (GOWN DISPOSABLE) IMPLANT
GOWN STRL REUS W/TWL LRG LVL3 (GOWN DISPOSABLE) ×2
GOWN STRL REUS W/TWL XL LVL3 (GOWN DISPOSABLE)
IPG PRECISION SPECTRA (Stimulator) ×1 IMPLANT
KIT BASIN OR (CUSTOM PROCEDURE TRAY) ×2 IMPLANT
KIT CHARGING (KITS) ×1
KIT CHARGING PRECISION NEURO (KITS) IMPLANT
KIT REMOTE CONTROL PRECISION (KITS) ×1 IMPLANT
KIT ROOM TURNOVER OR (KITS) ×2 IMPLANT
KIT SPLITTER 30CM 2X8 (Stimulator) ×2 IMPLANT
LEAD KIT CONTACT INFINION 16 (Stimulator) ×2 IMPLANT
LIQUID BAND (GAUZE/BANDAGES/DRESSINGS) ×2 IMPLANT
NDL 18GX1X1/2 (RX/OR ONLY) (NEEDLE) IMPLANT
NDL HYPO 25X1 1.5 SAFETY (NEEDLE) ×1 IMPLANT
NEEDLE 18GX1X1/2 (RX/OR ONLY) (NEEDLE) IMPLANT
NEEDLE HYPO 25X1 1.5 SAFETY (NEEDLE) ×2 IMPLANT
NS IRRIG 1000ML POUR BTL (IV SOLUTION) ×2 IMPLANT
PACK LAMINECTOMY NEURO (CUSTOM PROCEDURE TRAY) ×2 IMPLANT
PAD ARMBOARD 7.5X6 YLW CONV (MISCELLANEOUS) ×3 IMPLANT
PAD DRESSING TELFA 3X8 NADH (GAUZE/BANDAGES/DRESSINGS) IMPLANT
SPONGE LAP 4X18 X RAY DECT (DISPOSABLE) ×2 IMPLANT
SPONGE SURGIFOAM ABS GEL SZ50 (HEMOSTASIS) IMPLANT
STAPLER SKIN PROX WIDE 3.9 (STAPLE) ×2 IMPLANT
STRIP CLOSURE SKIN 1/2X4 (GAUZE/BANDAGES/DRESSINGS) IMPLANT
SUT MNCRL AB 4-0 PS2 18 (SUTURE) ×1 IMPLANT
SUT SILK 0 (SUTURE) ×2
SUT SILK 0 MO-6 18XCR BRD 8 (SUTURE) ×1 IMPLANT
SUT SILK 0 TIES 10X30 (SUTURE) IMPLANT
SUT SILK 2 0 TIES 10X30 (SUTURE) IMPLANT
SUT VIC AB 2-0 CP2 18 (SUTURE) ×4 IMPLANT
SYR EPIDURAL 5ML GLASS (SYRINGE) ×2 IMPLANT
SYRINGE 10CC LL (SYRINGE) IMPLANT
TOWEL OR 17X24 6PK STRL BLUE (TOWEL DISPOSABLE) ×2 IMPLANT
TOWEL OR 17X26 10 PK STRL BLUE (TOWEL DISPOSABLE) ×2 IMPLANT
WATER STERILE IRR 1000ML POUR (IV SOLUTION) ×2 IMPLANT
YANKAUER SUCT BULB TIP NO VENT (SUCTIONS) ×2 IMPLANT

## 2014-10-29 NOTE — Discharge Instructions (Addendum)
Dr. Ollen BowlHarkins Post-Op Orders   Ice Pack - 20 minutes on (in a pillow case), and 20 minutes off. Wear the ice pack UNDER the binder.  Follow up in office, they will call you for an appointment in 10 days to 2 weeks.  Increase activity gradually.    No lifting anything heavier than a gallon of milk (10 pounds) until seen in the office.  Advance diet slowly as tolerated.  Dressing care:  Keep dressing dry for 3 days, and on Post-op day 4, may shower.  Call for fever, drainage, and redness.  No swimming or bathing in a bathtub (do not get into standing water).  Mr. Arthur Cole may not return to work until 5/23

## 2014-10-29 NOTE — Transfer of Care (Signed)
Immediate Anesthesia Transfer of Care Note  Patient: Arthur Cole  Procedure(s) Performed: Procedure(s): LUMBAR SPINAL CORD STIMULATOR INSERTION (N/A)  Patient Location: PACU  Anesthesia Type:MAC  Level of Consciousness: awake, alert  and oriented  Airway & Oxygen Therapy: Patient Spontanous Breathing and Patient connected to nasal cannula oxygen  Post-op Assessment: Report given to RN and Post -op Vital signs reviewed and stable  Post vital signs: Reviewed and stable  Last Vitals:  Filed Vitals:   10/29/14 0755  BP: 141/89  Pulse: 83  Temp: 36.1 C  Resp: 18    Complications: No apparent anesthesia complications

## 2014-10-29 NOTE — Op Note (Signed)
PREOP DX: 1) lumbago  2) lumbar radiculopathy  3) multilevel lumbar degenerative disc disease 4) chronic pain  POSTOP DX: 1) lumbago  2) lumbar radiculopathy  3)  multilevel lumbar degenerative disc disease 4) chronic pain  PROCEDURES PERFORMED:1) intraop fluoro 2) placement of 2 16 contact [employer] Infinion leads 3) placement of Spectra SCS generator 4) post-op SCS programming SURGEON:Demetrie Borge  ASSISTANT: NONE  ANESTHESIA: MAC  EBL: <20cc  DESCRIPTION OF PROCEDURE: After a discussion of risks, benefits and alternatives, informed consent was obtained. The patient was taken to the OR, turned prone onto a Jackson table, all pressure points padded, SCD's placed, and an adequate plane of anesthesia induced. A timeout was taken to verify the correct patient, position, personnel, availability of appropriate equipment, and administration of perioperative antibiotics.  The thoracic and lumbar areas were widely prepped with chloraprep and draped into a sterile field. Fluoroscopy was used to plan a right paramedian incision at the L1-L3 levels, and an incision made with a 10 blade and carried down to the dorsolumbar fascia with the bovie and blunt dissection. Retractors were placed and a 14g [employer] tuohy needle placed into the epidural space at the T12-L1 interspace using biplanar fluoro and loss-of-resistance technique. The needle was aspirated without any return of fluid. A [employer] INFINION lead was introduced and under live AP fluoro advanced until the distal-most contact overlay the superior aspect of the T7 vertebral body shadow with the rest of the contacts distributed over the T8 and T9 vertebral bodies in a position just right of anatomic midline. A second Infinion lead was placed just left of anatomic midline in the same levels using the same technique.   The patient was awakened and the leads tested; impedances were good, and the patient reported good coverage  with amplitudes in the 5-7 mA range. 0 silk sutures were placed in the fascia adjacent to the needles. The needles and stylets were removed under fluoroscopy with no lead migration noted. Leads were then fixed to the fascia with Clik anchors; repeat images were obtained to verify that there had been no lead migration. The incision was inspected and hemostasis obtained with the bipolar cautery.  Attention was then turned to creation of a subcutaneous pocket. At the right flank, a 3 cm incision was made with a 10 blade after infiltrating the planned incision with lidocaine. Using the bovie and blunt dissection, a pocket of size appropriate to place a SCS generator was made. The pocket was trialed, and found to be of adequate size. The pocket was inspected for hemostasis, which was found to be excellent. Using reverse seldinger technique, the leads were tunneled to the pocket site, and the leads inserted into the SCS generator. Impedances were checked, and all found to be excellent. The leads were then all fixed into position with a self-torquing wrench. The wiring was all carefully coiled, placed behind the generator and placed in the pocket.  Both incisions were copiously irrigated with bacitracin-containing irrigation.  The lumbar incision was closed in 2 deep layers of interrupted 2-0 vicryl and the skin closed with staples. The pocket incision was closed with a deeper layer of 2-0 vicryl interrupted sutures, and the skin closed with staples. Antibiotic ointment and sterile dressings were applied.   Needle, sponge, and instrument counts were correct x2 at the end of the case.    The patient was then carefully awakened from anesthesia, turned supine, an abdominal binder placed, and the patient taken to the recovery room  where he underwent complex spinal cord stimulator programming with the representative under my direction.  COMPLICATIONS: NONE  CONDITION: Stable throughout the course of the  procedure and immediately afterward  DISPOSITION: discharge to home, with antibiotics and pain medicine. Discussed care with the patient and his significant other. Followup in clinic will be scheduled in 10-14 days

## 2014-10-29 NOTE — Anesthesia Postprocedure Evaluation (Signed)
  Anesthesia Post-op Note  Patient: Arthur Cole  Procedure(s) Performed: Procedure(s): LUMBAR SPINAL CORD STIMULATOR INSERTION (N/A)  Patient Location: PACU  Anesthesia Type:MAC  Level of Consciousness: awake, alert , oriented and patient cooperative  Airway and Oxygen Therapy: Patient Spontanous Breathing  Post-op Pain: none  Post-op Assessment: Post-op Vital signs reviewed, Patient's Cardiovascular Status Stable, Respiratory Function Stable, Patent Airway, No signs of Nausea or vomiting and Pain level controlled  Post-op Vital Signs: Reviewed and stable  Last Vitals:  Filed Vitals:   10/29/14 1130  BP: 131/76  Pulse: 81  Temp:   Resp: 23    Complications: No apparent anesthesia complications

## 2014-10-29 NOTE — Anesthesia Preprocedure Evaluation (Addendum)
Anesthesia Evaluation  Patient identified by MRN, date of birth, ID band Patient awake    Reviewed: Allergy & Precautions, NPO status , Patient's Chart, lab work & pertinent test results  History of Anesthesia Complications Negative for: history of anesthetic complications  Airway Mallampati: IV  TM Distance: >3 FB Neck ROM: Full  Mouth opening: Limited Mouth Opening  Dental  (+) Teeth Intact, Dental Advisory Given   Pulmonary neg pulmonary ROS, sleep apnea (mild, no CPAP now that has lost weight) ,  breath sounds clear to auscultation        Cardiovascular negative cardio ROS  Rhythm:Regular Rate:Normal     Neuro/Psych Anxiety Depression Chronic back pain: for stimulator today    GI/Hepatic Neg liver ROS, GERD-  Controlled,  Endo/Other  Morbid obesity  Renal/GU negative Renal ROS     Musculoskeletal   Abdominal (+) + obese,   Peds  Hematology   Anesthesia Other Findings   Reproductive/Obstetrics                            Anesthesia Physical Anesthesia Plan  ASA: III  Anesthesia Plan: MAC   Post-op Pain Management:    Induction: Intravenous  Airway Management Planned: Simple Face Mask and Natural Airway  Additional Equipment:   Intra-op Plan:   Post-operative Plan:   Informed Consent: I have reviewed the patients History and Physical, chart, labs and discussed the procedure including the risks, benefits and alternatives for the proposed anesthesia with the patient or authorized representative who has indicated his/her understanding and acceptance.   Dental advisory given  Plan Discussed with: Surgeon and CRNA  Anesthesia Plan Comments: (Plan routine monitors, MAC)        Anesthesia Quick Evaluation

## 2014-11-01 ENCOUNTER — Encounter (HOSPITAL_COMMUNITY): Payer: Self-pay | Admitting: Anesthesiology

## 2015-07-14 IMAGING — RF DG C-ARM 61-120 MIN
1 series · 1 of 1 positions shown · non-contrast
Comparison: None.

CLINICAL DATA: Thoracic spinal stimulator placement

EXAM:
DG C-ARM 61-120 MIN; THORACIC SPINE - 1 VIEW

[Series 1: run · 1 of 1 slices shown]
[im 1/1]
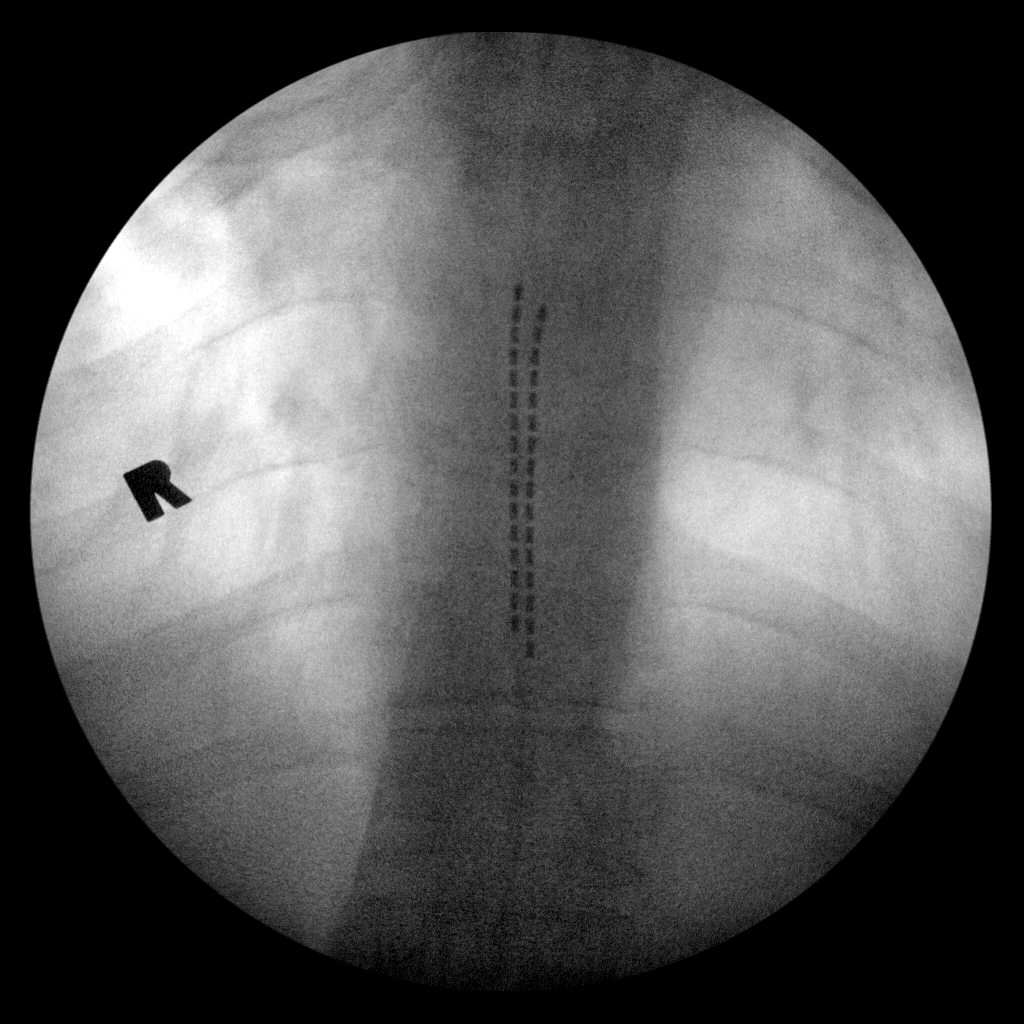

[1 of 1 positions shown; findings below may reference images not displayed]

FLUOROSCOPY TIME:  Radiation Exposure Index (as provided by the
fluoroscopic device): Not available

If the device does not provide the exposure index:

Fluoroscopy Time:  5 minutes 3 seconds

Number of Acquired Images:  1
FINDINGS: Spinal stimulator is noted in the mid to distal thoracic spine.
IMPRESSION: Intraoperative localization for stimulator placement

## 2016-03-07 ENCOUNTER — Other Ambulatory Visit: Payer: Self-pay | Admitting: Anesthesiology

## 2016-03-07 DIAGNOSIS — M4727 Other spondylosis with radiculopathy, lumbosacral region: Secondary | ICD-10-CM

## 2016-03-16 ENCOUNTER — Ambulatory Visit
Admission: RE | Admit: 2016-03-16 | Discharge: 2016-03-16 | Disposition: A | Discharge status: Home / Self Care | Payer: Managed Care, Other (non HMO) | Source: Ambulatory Visit | Attending: Anesthesiology | Admitting: Anesthesiology

## 2016-03-16 ENCOUNTER — Encounter: Payer: Self-pay | Admitting: Radiology

## 2016-03-16 DIAGNOSIS — M4727 Other spondylosis with radiculopathy, lumbosacral region: Secondary | ICD-10-CM

## 2016-03-16 MED ORDER — ONDANSETRON HCL 4 MG/2ML IJ SOLN
4.0000 mg | Freq: Once | INTRAMUSCULAR | Status: AC
  Administered 2016-03-16: 4 mg via INTRAMUSCULAR

## 2016-03-16 MED ORDER — MEPERIDINE HCL 100 MG/ML IJ SOLN
100.0000 mg | Freq: Once | INTRAMUSCULAR | Status: AC
  Administered 2016-03-16: 100 mg via INTRAMUSCULAR

## 2016-03-16 MED ORDER — ONDANSETRON HCL 4 MG/2ML IJ SOLN: 4.0000 mg | Freq: Four times a day (QID) | INTRAMUSCULAR | Status: DC | PRN

## 2016-03-16 MED ORDER — IOPAMIDOL (ISOVUE-M 300) INJECTION 61%
10.0000 mL | Freq: Once | INTRAMUSCULAR | Status: AC | PRN
  Administered 2016-03-16: 10 mL via INTRATHECAL

## 2016-03-16 MED ORDER — DIAZEPAM 5 MG PO TABS
10.0000 mg | ORAL_TABLET | Freq: Once | ORAL | Status: AC
  Administered 2016-03-16: 10 mg via ORAL

## 2016-03-16 NOTE — Progress Notes (Signed)
Pt states he has been off Trazodone since Sunday. 

## 2016-03-16 NOTE — Discharge Instructions (Signed)
Myelogram Discharge Instructions  1. Go home and rest quietly for the next 24 hours.  It is important to lie flat for the next 24 hours.  Get up only to go to the restroom.  You may lie in the bed or on a couch on your back, your stomach, your left side or your right side.  You may have one pillow under your head.  You may have pillows between your knees while you are on your side or under your knees while you are on your back.  2. DO NOT drive today.  Recline the seat as far back as it will go, while still wearing your seat belt, on the way home.  3. You may get up to go to the bathroom as needed.  You may sit up for 10 minutes to eat.  You may resume your normal diet and medications unless otherwise indicated.  Drink lots of extra fluids today and tomorrow.  4. The incidence of headache, nausea, or vomiting is about 5% (one in 20 patients).  If you develop a headache, lie flat and drink plenty of fluids until the headache goes away.  Caffeinated beverages may be helpful.  If you develop severe nausea and vomiting or a headache that does not go away with flat bed rest, call [phone].  5. You may resume normal activities after your 24 hours of bed rest is over; however, do not exert yourself strongly or do any heavy lifting tomorrow. If when you get up you have a headache when standing, go back to bed and force fluids for another 24 hours.  6. Call your physician for a follow-up appointment.  The results of your myelogram will be sent directly to your physician by the following day.  7. If you have any questions or if complications develop after you arrive home, please call [phone].  Discharge instructions have been explained to the patient.  The patient, or the person responsible for the patient, fully understands these instructions.       May resume Trazodone on Sept. 30, 2107, after 1:00 pm.

## 2016-03-19 ENCOUNTER — Telehealth: Payer: Self-pay

## 2016-03-19 NOTE — Telephone Encounter (Signed)
Spoke with patient after his myelogram here 03/16/16.  He states he has done fine and that he appreciates "all the fine care we took of" him.  Denies headache.  jkl

## 2016-11-29 IMAGING — CT CT T SPINE W/ CM
1 series · 12 of 14 positions shown, 15 images · non-contrast
Comparison: Lumbar spine MRI 10/09/2013 and thoracic spine MRI
06/05/2014

CLINICAL DATA: Lumbosacral spondylosis with radiculopathy. Severe
bilateral lower extremity pain. Prior spinal cord stimulator
placement.
TECHNIQUE: Contiguous axial images were obtained through the Thoracic and
Lumbar spine after the intrathecal infusion of infusion. Coronal and
sagittal reconstructions were obtained of the axial image sets.

[Series 3: t spine soft · axial · 0.37mm/px · z∈[+985,+1303]mm · 12 of 126 slices shown, 15 images]
[im 10/126  soft-tissue]
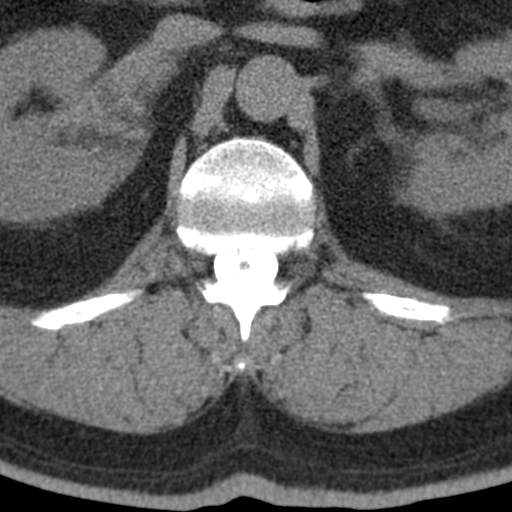
[im 10/126  bone]
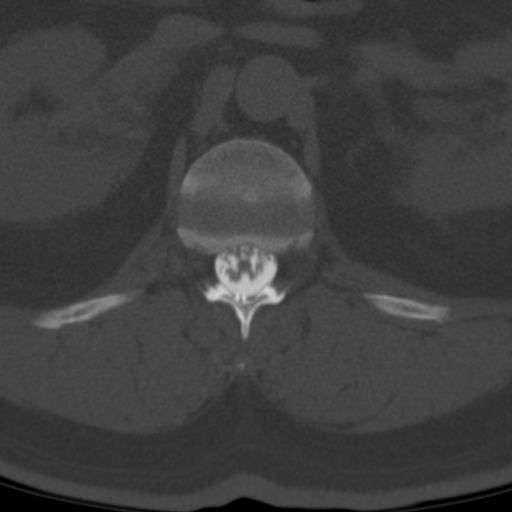
[im 20/126  bone]
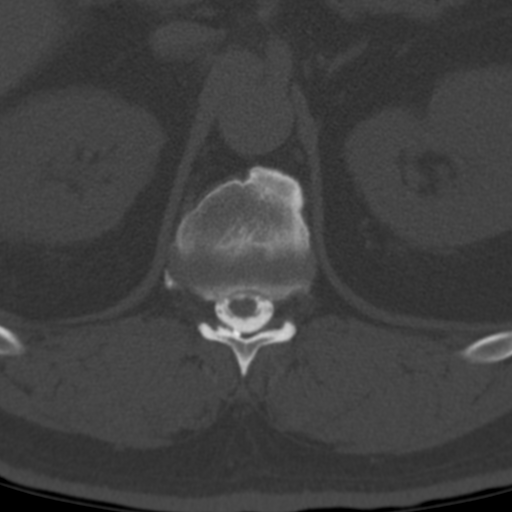
[im 29/126  bone]
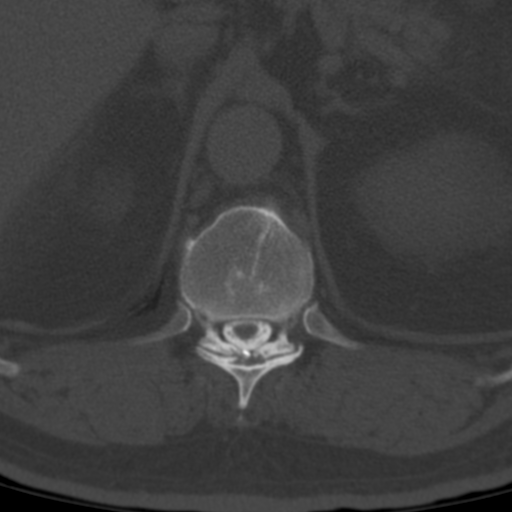
[im 39/126  bone]
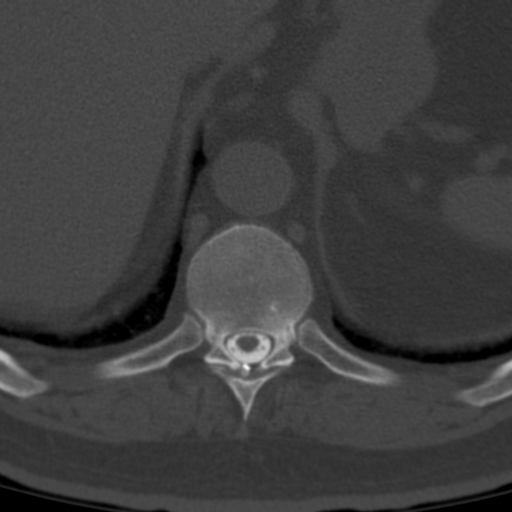
[im 49/126  soft-tissue]
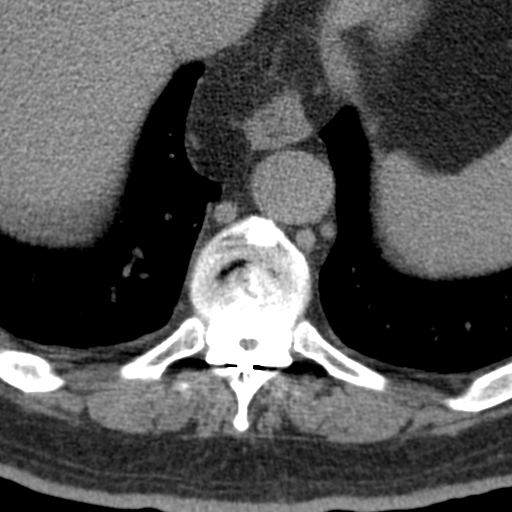
[im 49/126  bone]
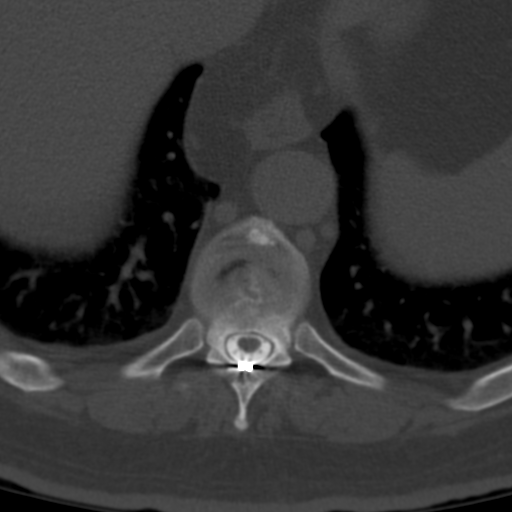
[im 58/126  bone]
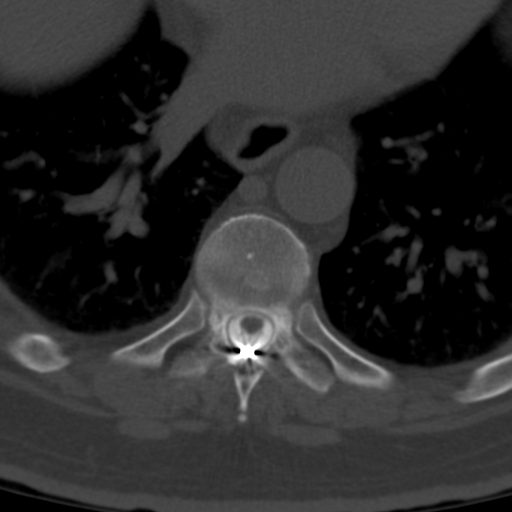
[im 68/126  bone]
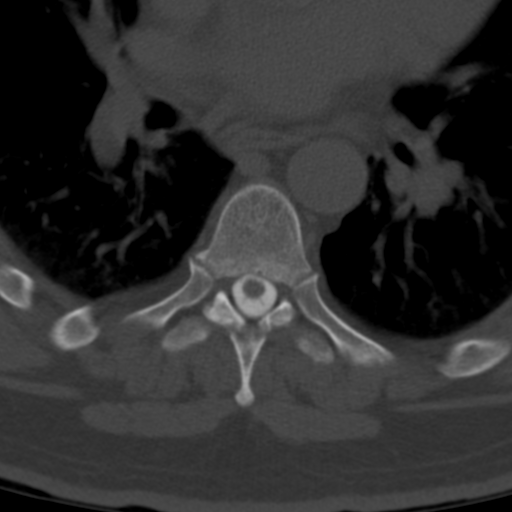
[im 77/126  bone]
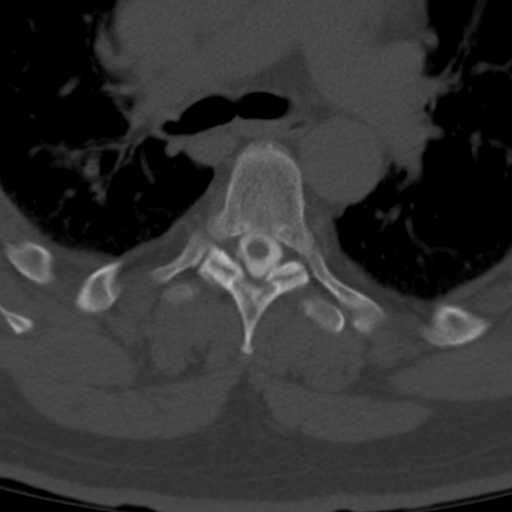
[im 87/126  soft-tissue]
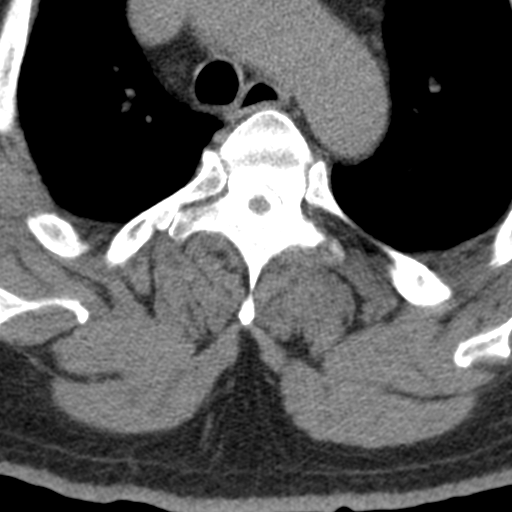
[im 87/126  bone]
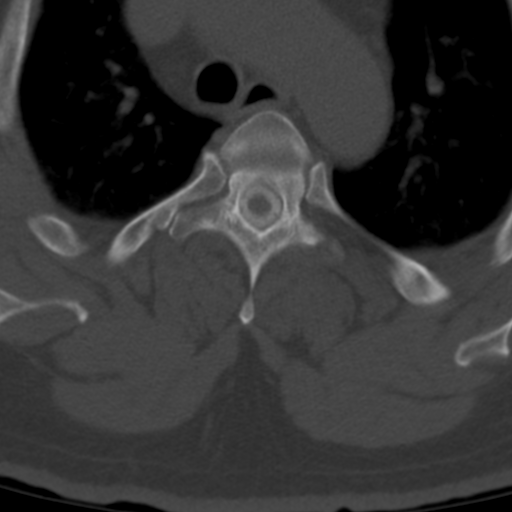
[im 97/126  bone]
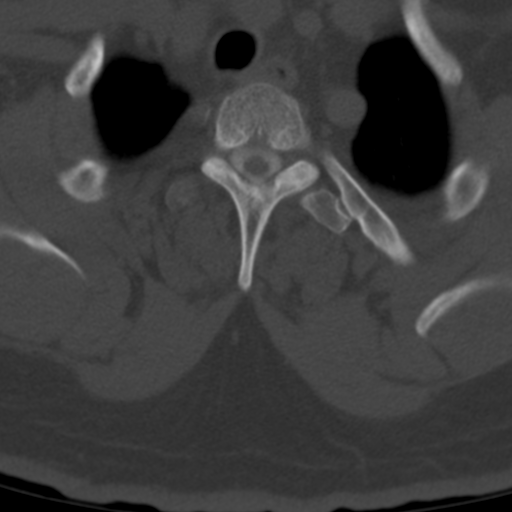
[im 106/126  bone]
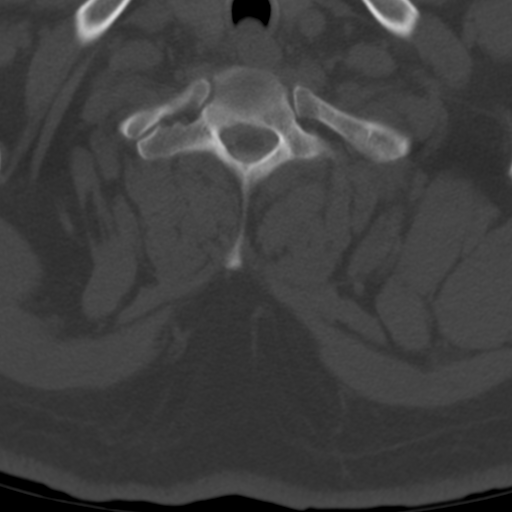
[im 116/126  bone]
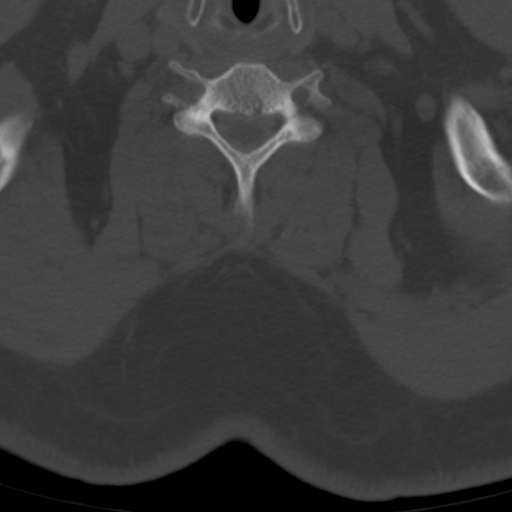

[12 of 14 positions shown; findings below may reference images not displayed]

FLUOROSCOPY TIME:  Radiation Exposure Index (as provided by the
fluoroscopic device): 804.98 microGray*m^2

Fluoroscopy Time (in minutes and seconds):  1 minute 13 seconds

PROCEDURE:
LUMBAR PUNCTURE FOR THORACIC AND LUMBAR MYELOGRAM

After thorough discussion of risks and benefits of the procedure
including bleeding, infection, injury to nerves, blood vessels,
adjacent structures as well as headache and CSF leak, written and
oral informed consent was obtained. Consent was obtained by Dr.
Lasvecars Modidima Namogang.

Patient was positioned prone on the fluoroscopy table. Local
anesthesia was provided with 1% lidocaine without epinephrine after
prepped and draped in the usual sterile fashion. Puncture was
performed at L2-3 using a 5 inch 22-gauge spinal needle via a left
interlaminar approach. Using a single pass through the dura, the
needle was placed within the thecal sac, with return of clear CSF.
10 mL of Isovue M 300 was injected into the thecal sac, with normal
opacification of the nerve roots and cauda equina consistent with
free flow within the subarachnoid space. The patient was then moved
to the trendelenburg position and contrast flowed into the Thoracic
spine region.

I personally performed the lumbar puncture and administered the
intrathecal contrast. I also personally supervised acquisition of
the myelogram images.
FINDINGS: THORACIC AND LUMBAR MYELOGRAM FINDINGS:

There is trace retrolisthesis of L1 on L2 and L4 on L5 without
significant change during flexion or extension. Mild disc space
narrowing and small ventral extradural defects are present at L3-4
and L4-5 without evidence of significant stenosis. No nerve root
sleeve cut off in the lumbar spine. No significant stenosis
identified in the thoracic spine. Spinal cord stimulator leads
terminate at T7 (unchanged from 05/17/2015 thoracic spine
radiographs).

CT THORACIC MYELOGRAM FINDINGS:

Mildly exaggerated thoracic kyphosis, unchanged. No listhesis.
Multiple small Schmorl's nodes are again seen in the mid to lower
thoracic spine with unchanged chronic mild T8 anterior vertebral
body height loss. No acute fracture is identified. The thoracic
spinal cord is normal in caliber. Conus medullaris terminates at
T12-L1. Spinal cord stimulator leads enter the dorsal canal at
T12-L1 and course superiorly into the mid thoracic spine,
terminating at the T7 level. Paraspinal soft tissues are
unremarkable.

Mid and lower thoracic spine disc degeneration does not appear
significantly different than on the prior MRI. Mild-to-moderate disc
space narrowing and vacuum disc phenomenon are present from T6-7 to
T9-10. There is mild disc space narrowing with prominent anterior
osteophytosis at T11-12. Small left paracentral disc protrusion at
T3-4, small left paracentral disc osteophyte complex at T4-5, tiny
left paracentral disc osteophyte complex at T5-6, tiny central disc
extrusion at T6-7, and mild posterior osteophytic ridging at T8-9 do
not appear significantly changed and do not result in spinal
stenosis, significant spinal cord mass effect, or neural foraminal
stenosis.

CT LUMBAR MYELOGRAM FINDINGS:

Unchanged vertebral alignment with trace retrolisthesis of L1 on L2
and L4 on L5. Preserved vertebral body heights without evidence of
fracture. Mild disc space narrowing is again seen at L3-4 and L4-5.
Conus medullaris terminates at T12-L1. No evidence of intradural
mass. Spinal stimulator generator noted in the right lower back.

L1-2:  Mild facet spurring.  No disc herniation or stenosis.

L2-3:  Mild facet spurring.  No disc herniation or stenosis.

L3-4: Small central disc extrusion on the prior MRI has regressed.
There is mild disc bulging asymmetric to the left and mild facet
spurring without stenosis.

L4-5: Small central disc protrusion, mild disc bulging, and mild
facet hypertrophy without stenosis, unchanged.

L5-S1: Minimal disc bulging and mild right and moderate left facet
arthrosis without significant stenosis, unchanged.
IMPRESSION: 1. Mild mid to lower thoracic disc degeneration, unchanged and
without stenosis.
2. Regression of L3-4 disc extrusion without residual stenosis.
3. Mild disc and mild-to-moderate facet degeneration elsewhere in
the lumbar spine without stenosis, unchanged.
4. Spinal cord stimulator in place, terminating at T7.

## 2016-11-29 IMAGING — XA DG MYELOGRAPHY LUMBAR INJ MULTI REGION
12 of 23 series · 12 of 23 positions shown · non-contrast
Comparison: Lumbar spine MRI 10/09/2013 and thoracic spine MRI
06/05/2014

CLINICAL DATA: Lumbosacral spondylosis with radiculopathy. Severe
bilateral lower extremity pain. Prior spinal cord stimulator
placement.
TECHNIQUE: Contiguous axial images were obtained through the Thoracic and
Lumbar spine after the intrathecal infusion of infusion. Coronal and
sagittal reconstructions were obtained of the axial image sets.

[Series 1: vasc adipose · 1 of 1 slices shown (1 of 11)]
[im 1/1]
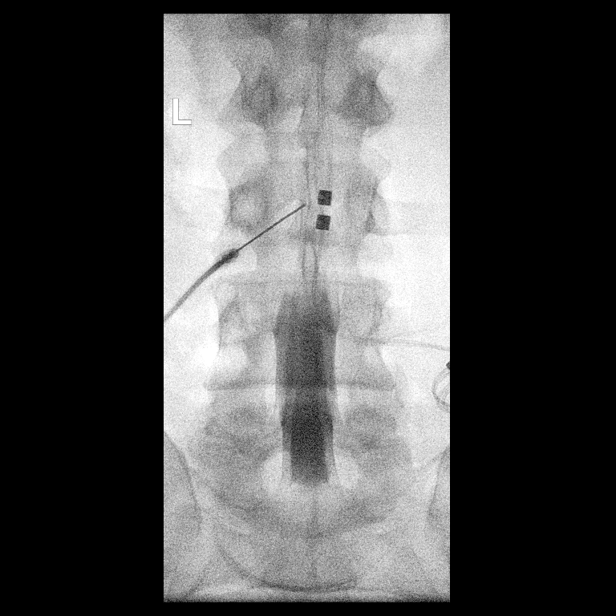

[Series 2: w lumbar spine flexion · 0.15mm/px · 1 of 1 slices shown]
[im 1/1]
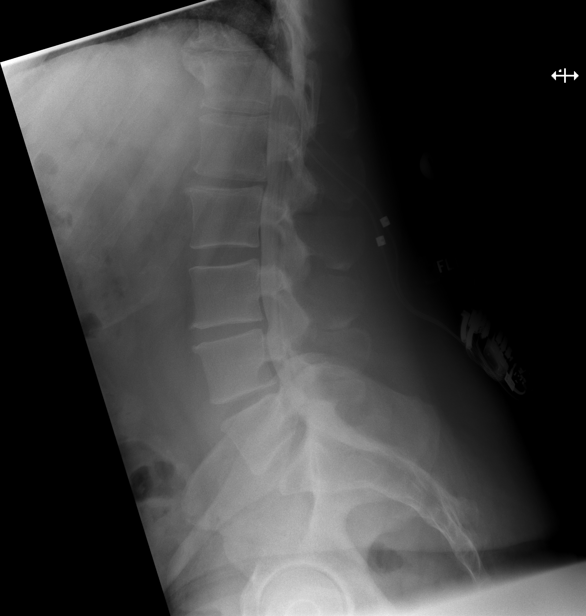

[Series 3: vasc adipose · 1 of 1 slices shown (2 of 11)]
[im 1/1]
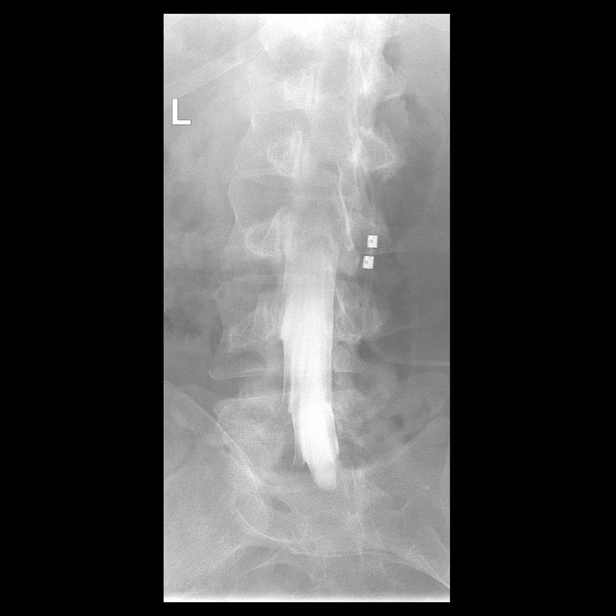

[Series 4: vasc adipose · 1 of 1 slices shown (3 of 11)]
[im 1/1]
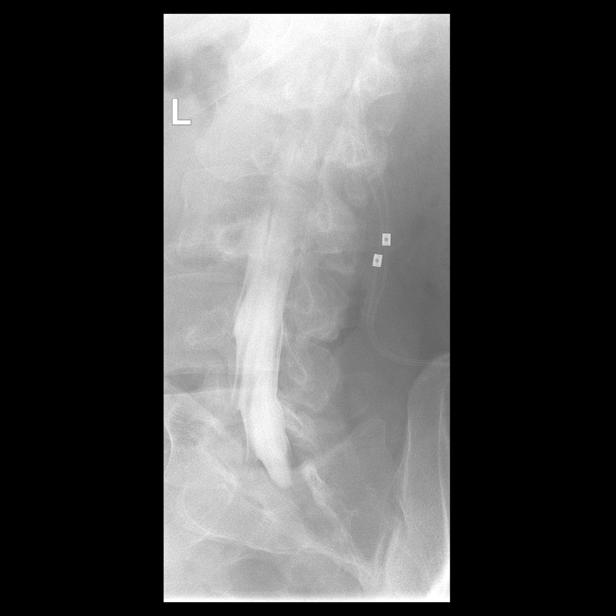

[Series 6: vasc adipose · 1 of 1 slices shown (4 of 11)]
[im 1/1]
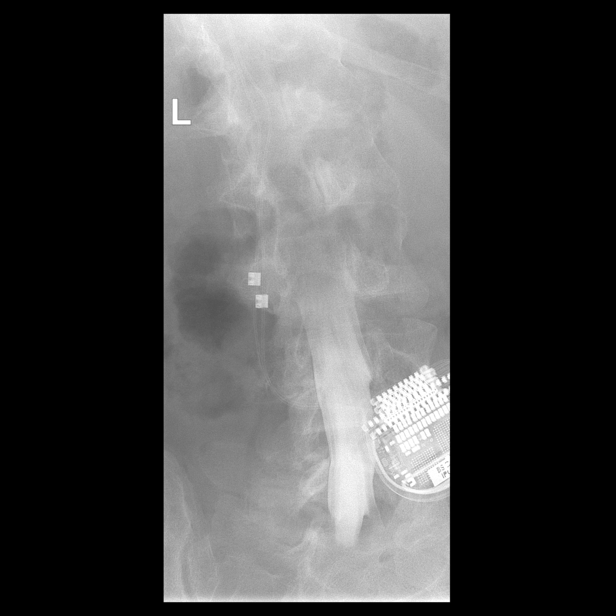

[Series 8: vasc adipose · 1 of 1 slices shown (5 of 11)]
[im 1/1]
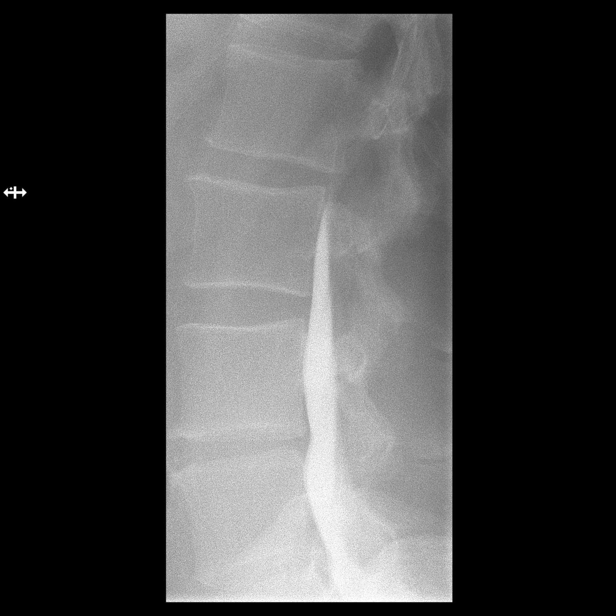

[Series 10: vasc adipose · 1 of 1 slices shown (6 of 11)]
[im 1/1]
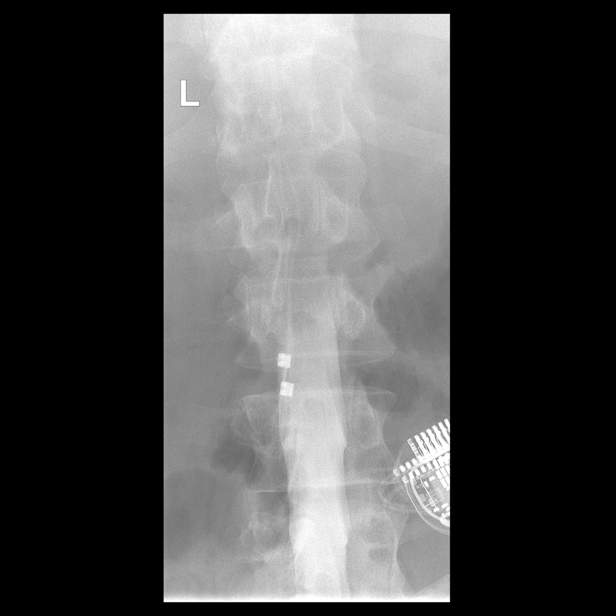

[Series 12: vasc adipose · 1 of 1 slices shown (7 of 11)]
[im 1/1]
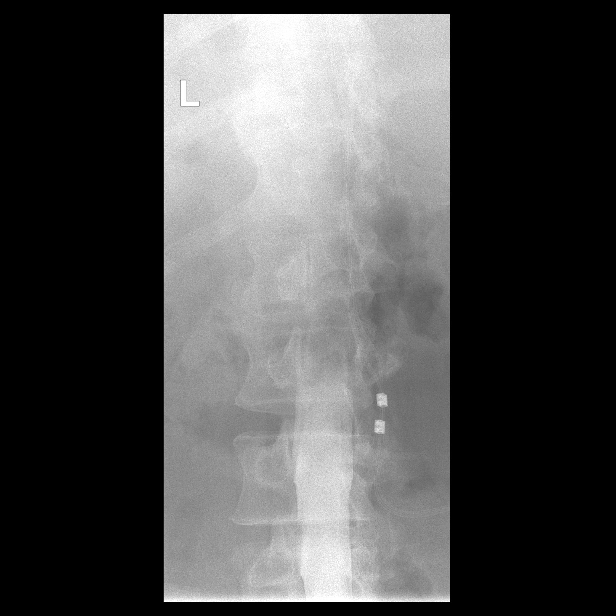

[Series 14: vasc adipose · 1 of 1 slices shown (8 of 11)]
[im 1/1]
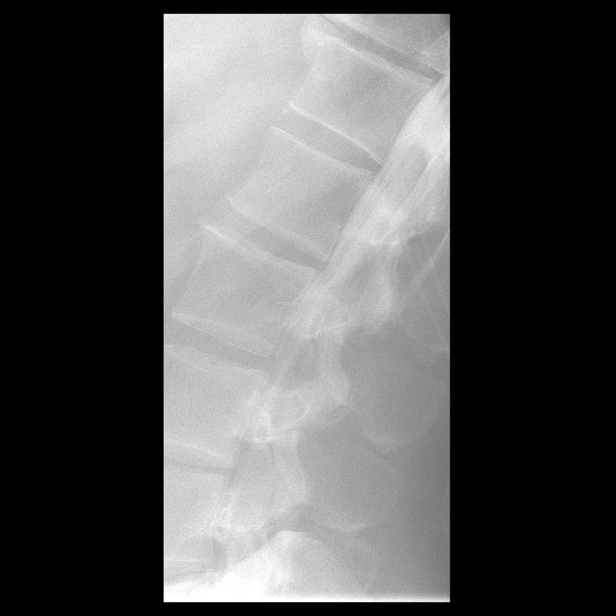

[Series 16: vasc adipose · 1 of 1 slices shown (9 of 11)]
[im 1/1]
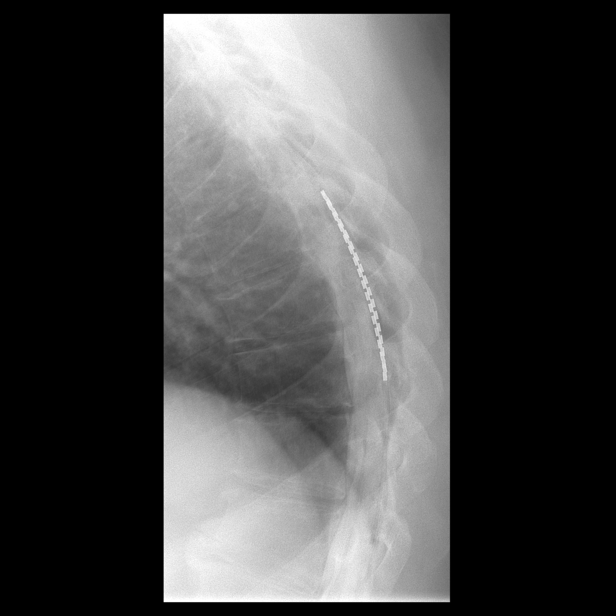

[Series 18: vasc adipose · 1 of 1 slices shown (10 of 11)]
[im 1/1]
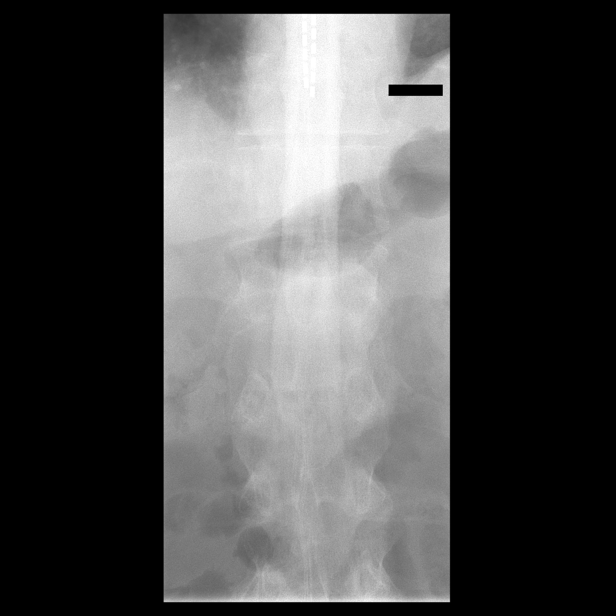

[Series 20: vasc adipose · 1 of 1 slices shown (11 of 11)]
[im 1/1]
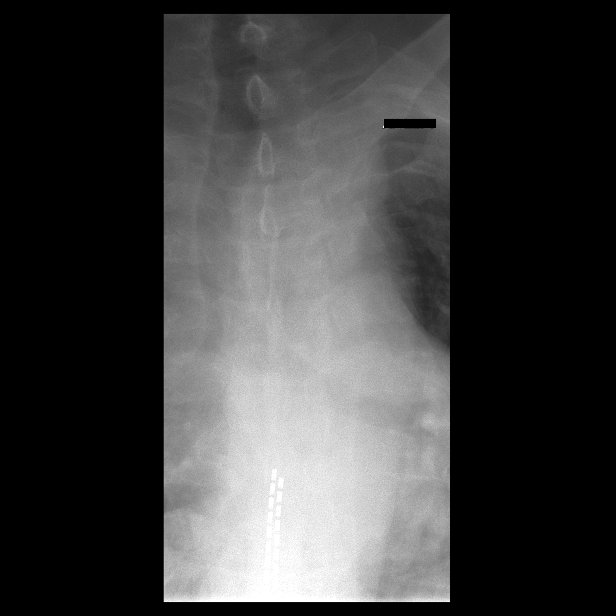

[12 of 23 positions shown; findings below may reference images not displayed]

FLUOROSCOPY TIME:  Radiation Exposure Index (as provided by the
fluoroscopic device): 804.98 microGray*m^2

Fluoroscopy Time (in minutes and seconds):  1 minute 13 seconds

PROCEDURE:
LUMBAR PUNCTURE FOR THORACIC AND LUMBAR MYELOGRAM

After thorough discussion of risks and benefits of the procedure
including bleeding, infection, injury to nerves, blood vessels,
adjacent structures as well as headache and CSF leak, written and
oral informed consent was obtained. Consent was obtained by Dr.
Lasvecars Modidima Namogang.

Patient was positioned prone on the fluoroscopy table. Local
anesthesia was provided with 1% lidocaine without epinephrine after
prepped and draped in the usual sterile fashion. Puncture was
performed at L2-3 using a 5 inch 22-gauge spinal needle via a left
interlaminar approach. Using a single pass through the dura, the
needle was placed within the thecal sac, with return of clear CSF.
10 mL of Isovue M 300 was injected into the thecal sac, with normal
opacification of the nerve roots and cauda equina consistent with
free flow within the subarachnoid space. The patient was then moved
to the trendelenburg position and contrast flowed into the Thoracic
spine region.

I personally performed the lumbar puncture and administered the
intrathecal contrast. I also personally supervised acquisition of
the myelogram images.
FINDINGS: THORACIC AND LUMBAR MYELOGRAM FINDINGS:

There is trace retrolisthesis of L1 on L2 and L4 on L5 without
significant change during flexion or extension. Mild disc space
narrowing and small ventral extradural defects are present at L3-4
and L4-5 without evidence of significant stenosis. No nerve root
sleeve cut off in the lumbar spine. No significant stenosis
identified in the thoracic spine. Spinal cord stimulator leads
terminate at T7 (unchanged from 05/17/2015 thoracic spine
radiographs).

CT THORACIC MYELOGRAM FINDINGS:

Mildly exaggerated thoracic kyphosis, unchanged. No listhesis.
Multiple small Schmorl's nodes are again seen in the mid to lower
thoracic spine with unchanged chronic mild T8 anterior vertebral
body height loss. No acute fracture is identified. The thoracic
spinal cord is normal in caliber. Conus medullaris terminates at
T12-L1. Spinal cord stimulator leads enter the dorsal canal at
T12-L1 and course superiorly into the mid thoracic spine,
terminating at the T7 level. Paraspinal soft tissues are
unremarkable.

Mid and lower thoracic spine disc degeneration does not appear
significantly different than on the prior MRI. Mild-to-moderate disc
space narrowing and vacuum disc phenomenon are present from T6-7 to
T9-10. There is mild disc space narrowing with prominent anterior
osteophytosis at T11-12. Small left paracentral disc protrusion at
T3-4, small left paracentral disc osteophyte complex at T4-5, tiny
left paracentral disc osteophyte complex at T5-6, tiny central disc
extrusion at T6-7, and mild posterior osteophytic ridging at T8-9 do
not appear significantly changed and do not result in spinal
stenosis, significant spinal cord mass effect, or neural foraminal
stenosis.

CT LUMBAR MYELOGRAM FINDINGS:

Unchanged vertebral alignment with trace retrolisthesis of L1 on L2
and L4 on L5. Preserved vertebral body heights without evidence of
fracture. Mild disc space narrowing is again seen at L3-4 and L4-5.
Conus medullaris terminates at T12-L1. No evidence of intradural
mass. Spinal stimulator generator noted in the right lower back.

L1-2:  Mild facet spurring.  No disc herniation or stenosis.

L2-3:  Mild facet spurring.  No disc herniation or stenosis.

L3-4: Small central disc extrusion on the prior MRI has regressed.
There is mild disc bulging asymmetric to the left and mild facet
spurring without stenosis.

L4-5: Small central disc protrusion, mild disc bulging, and mild
facet hypertrophy without stenosis, unchanged.

L5-S1: Minimal disc bulging and mild right and moderate left facet
arthrosis without significant stenosis, unchanged.
IMPRESSION: 1. Mild mid to lower thoracic disc degeneration, unchanged and
without stenosis.
2. Regression of L3-4 disc extrusion without residual stenosis.
3. Mild disc and mild-to-moderate facet degeneration elsewhere in
the lumbar spine without stenosis, unchanged.
4. Spinal cord stimulator in place, terminating at T7.

## 2016-11-29 IMAGING — CT CT L SPINE W/ CM
1 of 7 series · 6 of 14 positions shown, 8 images · non-contrast
Comparison: Lumbar spine MRI 10/09/2013 and thoracic spine MRI
06/05/2014

CLINICAL DATA: Lumbosacral spondylosis with radiculopathy. Severe
bilateral lower extremity pain. Prior spinal cord stimulator
placement.
TECHNIQUE: Contiguous axial images were obtained through the Thoracic and
Lumbar spine after the intrathecal infusion of infusion. Coronal and
sagittal reconstructions were obtained of the axial image sets.

[Series 3: l spine soft · axial · 0.38mm/px · z∈[+795,+1005]mm · 6 of 100 slices shown, 8 images]
[im 15/100  soft-tissue]
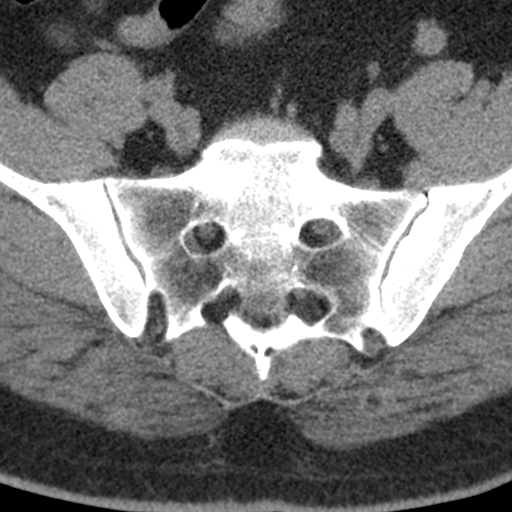
[im 15/100  bone]
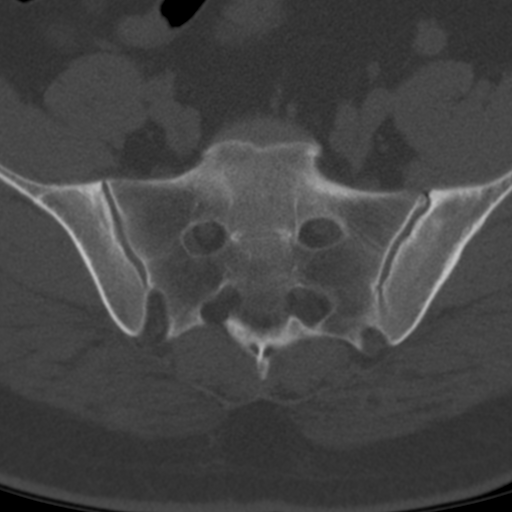
[im 29/100  bone]
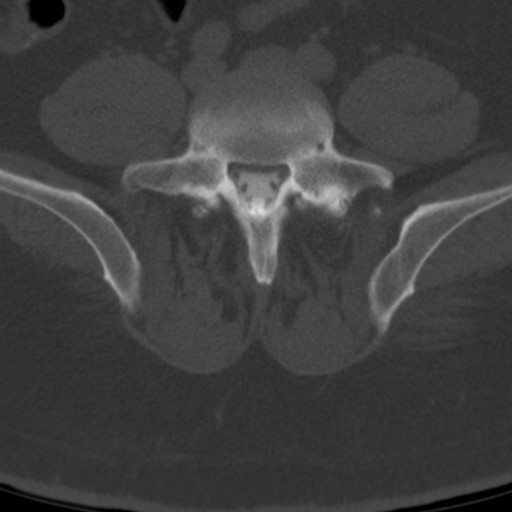
[im 43/100  bone]
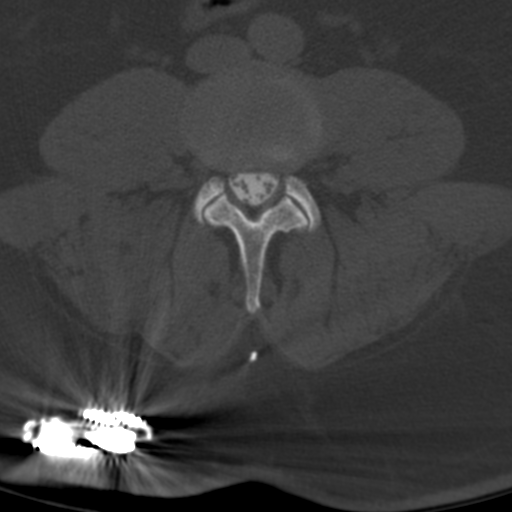
[im 57/100  bone]
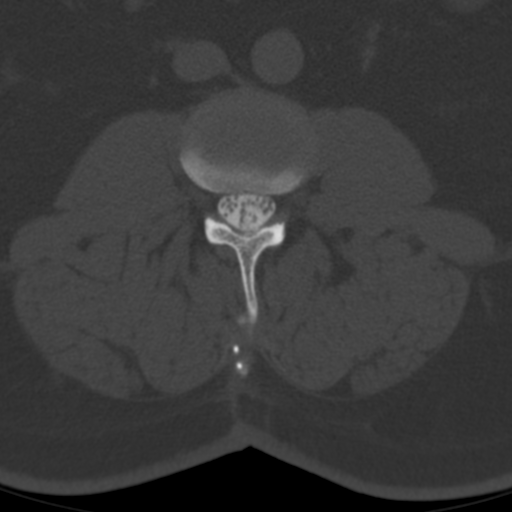
[im 71/100  soft-tissue]
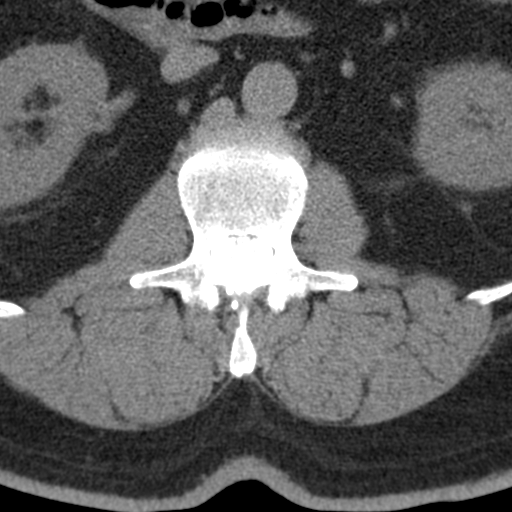
[im 71/100  bone]
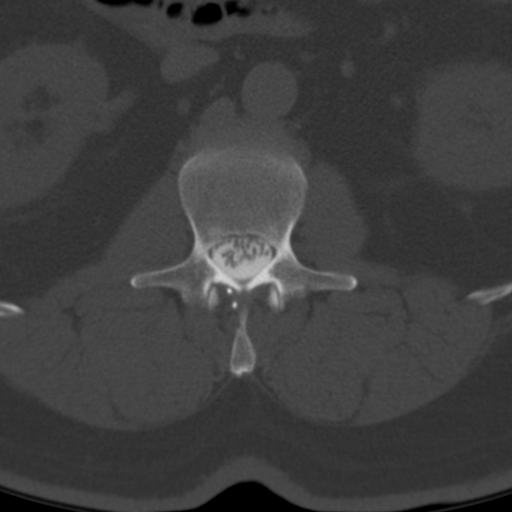
[im 85/100  bone]
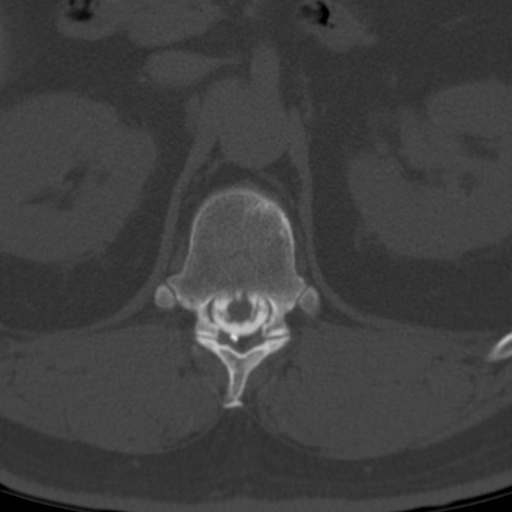

[6 of 14 positions shown; findings below may reference images not displayed]

FLUOROSCOPY TIME:  Radiation Exposure Index (as provided by the
fluoroscopic device): 804.98 microGray*m^2

Fluoroscopy Time (in minutes and seconds):  1 minute 13 seconds

PROCEDURE:
LUMBAR PUNCTURE FOR THORACIC AND LUMBAR MYELOGRAM

After thorough discussion of risks and benefits of the procedure
including bleeding, infection, injury to nerves, blood vessels,
adjacent structures as well as headache and CSF leak, written and
oral informed consent was obtained. Consent was obtained by Dr.
Lasvecars Modidima Namogang.

Patient was positioned prone on the fluoroscopy table. Local
anesthesia was provided with 1% lidocaine without epinephrine after
prepped and draped in the usual sterile fashion. Puncture was
performed at L2-3 using a 5 inch 22-gauge spinal needle via a left
interlaminar approach. Using a single pass through the dura, the
needle was placed within the thecal sac, with return of clear CSF.
10 mL of Isovue M 300 was injected into the thecal sac, with normal
opacification of the nerve roots and cauda equina consistent with
free flow within the subarachnoid space. The patient was then moved
to the trendelenburg position and contrast flowed into the Thoracic
spine region.

I personally performed the lumbar puncture and administered the
intrathecal contrast. I also personally supervised acquisition of
the myelogram images.
FINDINGS: THORACIC AND LUMBAR MYELOGRAM FINDINGS:

There is trace retrolisthesis of L1 on L2 and L4 on L5 without
significant change during flexion or extension. Mild disc space
narrowing and small ventral extradural defects are present at L3-4
and L4-5 without evidence of significant stenosis. No nerve root
sleeve cut off in the lumbar spine. No significant stenosis
identified in the thoracic spine. Spinal cord stimulator leads
terminate at T7 (unchanged from 05/17/2015 thoracic spine
radiographs).

CT THORACIC MYELOGRAM FINDINGS:

Mildly exaggerated thoracic kyphosis, unchanged. No listhesis.
Multiple small Schmorl's nodes are again seen in the mid to lower
thoracic spine with unchanged chronic mild T8 anterior vertebral
body height loss. No acute fracture is identified. The thoracic
spinal cord is normal in caliber. Conus medullaris terminates at
T12-L1. Spinal cord stimulator leads enter the dorsal canal at
T12-L1 and course superiorly into the mid thoracic spine,
terminating at the T7 level. Paraspinal soft tissues are
unremarkable.

Mid and lower thoracic spine disc degeneration does not appear
significantly different than on the prior MRI. Mild-to-moderate disc
space narrowing and vacuum disc phenomenon are present from T6-7 to
T9-10. There is mild disc space narrowing with prominent anterior
osteophytosis at T11-12. Small left paracentral disc protrusion at
T3-4, small left paracentral disc osteophyte complex at T4-5, tiny
left paracentral disc osteophyte complex at T5-6, tiny central disc
extrusion at T6-7, and mild posterior osteophytic ridging at T8-9 do
not appear significantly changed and do not result in spinal
stenosis, significant spinal cord mass effect, or neural foraminal
stenosis.

CT LUMBAR MYELOGRAM FINDINGS:

Unchanged vertebral alignment with trace retrolisthesis of L1 on L2
and L4 on L5. Preserved vertebral body heights without evidence of
fracture. Mild disc space narrowing is again seen at L3-4 and L4-5.
Conus medullaris terminates at T12-L1. No evidence of intradural
mass. Spinal stimulator generator noted in the right lower back.

L1-2:  Mild facet spurring.  No disc herniation or stenosis.

L2-3:  Mild facet spurring.  No disc herniation or stenosis.

L3-4: Small central disc extrusion on the prior MRI has regressed.
There is mild disc bulging asymmetric to the left and mild facet
spurring without stenosis.

L4-5: Small central disc protrusion, mild disc bulging, and mild
facet hypertrophy without stenosis, unchanged.

L5-S1: Minimal disc bulging and mild right and moderate left facet
arthrosis without significant stenosis, unchanged.
IMPRESSION: 1. Mild mid to lower thoracic disc degeneration, unchanged and
without stenosis.
2. Regression of L3-4 disc extrusion without residual stenosis.
3. Mild disc and mild-to-moderate facet degeneration elsewhere in
the lumbar spine without stenosis, unchanged.
4. Spinal cord stimulator in place, terminating at T7.
